# Patient Record
Sex: Male | Born: 1945 | Race: White | Hispanic: No | Marital: Single | Smoking: Never smoker
Health system: Southern US, Community
[De-identification: ages and names within clinical notes are randomized; demographics above are authoritative.]

## PROBLEM LIST (undated history)

## (undated) DIAGNOSIS — F419 Anxiety disorder, unspecified: Secondary | ICD-10-CM

## (undated) DIAGNOSIS — N400 Enlarged prostate without lower urinary tract symptoms: Secondary | ICD-10-CM

## (undated) DIAGNOSIS — I214 Non-ST elevation (NSTEMI) myocardial infarction: Secondary | ICD-10-CM

## (undated) DIAGNOSIS — I4891 Unspecified atrial fibrillation: Secondary | ICD-10-CM

## (undated) DIAGNOSIS — N183 Chronic kidney disease, stage 3 unspecified: Secondary | ICD-10-CM

## (undated) DIAGNOSIS — I429 Cardiomyopathy, unspecified: Secondary | ICD-10-CM

## (undated) DIAGNOSIS — I2510 Atherosclerotic heart disease of native coronary artery without angina pectoris: Secondary | ICD-10-CM

## (undated) DIAGNOSIS — J45909 Unspecified asthma, uncomplicated: Secondary | ICD-10-CM

## (undated) DIAGNOSIS — H3530 Unspecified macular degeneration: Secondary | ICD-10-CM

## (undated) DIAGNOSIS — C439 Malignant melanoma of skin, unspecified: Secondary | ICD-10-CM

## (undated) DIAGNOSIS — I10 Essential (primary) hypertension: Secondary | ICD-10-CM

## (undated) HISTORY — DX: Unspecified atrial fibrillation: I48.91

## (undated) HISTORY — DX: Non-ST elevation (NSTEMI) myocardial infarction: I21.4

## (undated) HISTORY — DX: Cardiomyopathy, unspecified: I42.9

## (undated) HISTORY — DX: Chronic kidney disease, stage 3 unspecified: N18.30

## (undated) HISTORY — DX: Unspecified macular degeneration: H35.30

## (undated) HISTORY — DX: Benign prostatic hyperplasia without lower urinary tract symptoms: N40.0

## (undated) HISTORY — DX: Atherosclerotic heart disease of native coronary artery without angina pectoris: I25.10

## (undated) HISTORY — DX: Essential (primary) hypertension: I10

## (undated) HISTORY — DX: Other postprocedural complications and disorders of the circulatory system, not elsewhere classified: I48.91

## (undated) HISTORY — DX: Malignant melanoma of skin, unspecified: C43.9

## (undated) HISTORY — DX: Unspecified asthma, uncomplicated: J45.909

## (undated) HISTORY — DX: Anxiety disorder, unspecified: F41.9

---

## 2012-01-05 ENCOUNTER — Encounter (INDEPENDENT_AMBULATORY_CARE_PROVIDER_SITE_OTHER): Payer: Self-pay | Admitting: *Deleted

## 2012-01-07 ENCOUNTER — Telehealth (INDEPENDENT_AMBULATORY_CARE_PROVIDER_SITE_OTHER): Payer: Self-pay | Admitting: *Deleted

## 2012-01-07 ENCOUNTER — Other Ambulatory Visit (INDEPENDENT_AMBULATORY_CARE_PROVIDER_SITE_OTHER): Payer: Self-pay | Admitting: *Deleted

## 2012-01-07 ENCOUNTER — Encounter (INDEPENDENT_AMBULATORY_CARE_PROVIDER_SITE_OTHER): Payer: Self-pay | Admitting: *Deleted

## 2012-01-07 DIAGNOSIS — Z1211 Encounter for screening for malignant neoplasm of colon: Secondary | ICD-10-CM

## 2012-01-07 NOTE — Telephone Encounter (Signed)
Patient needs movi prep 

## 2012-01-09 MED ORDER — PEG-KCL-NACL-NASULF-NA ASC-C 100 G PO SOLR: 1.0000 | Freq: Once | ORAL | Status: DC

## 2012-02-16 ENCOUNTER — Telehealth (INDEPENDENT_AMBULATORY_CARE_PROVIDER_SITE_OTHER): Payer: Self-pay | Admitting: *Deleted

## 2012-02-16 NOTE — Telephone Encounter (Signed)
agree

## 2012-02-16 NOTE — Telephone Encounter (Signed)
PCP/Requesting MD: shah  Name & DOB: Craig Forbes 08-Jan-2046     Procedure: tcs  Reason/Indication:  screening  Has patient had this procedure before?  no  If so, when, by whom and where?    Is there a family history of colon cancer?  no  Who?  What age when diagnosed?    Is patient diabetic?   no      Does patient have prosthetic heart valve?  no  Do you have a pacemaker?  no  Has patient had joint replacement within last 12 months?  no  Is patient on Coumadin, Plavix and/or Aspirin? yes  Medications: asa 81 mg daily, amlodipine 10 mg daily, omega 3 fish oil 1000/300 mg bid, red yeast rice 600 mg bid  Allergies: nkda  Medication Adjustment: asa 2 days  Procedure date & time: 03/11/12 at 730

## 2012-02-27 ENCOUNTER — Encounter (HOSPITAL_COMMUNITY): Payer: Self-pay | Admitting: Pharmacy Technician

## 2012-03-10 MED ORDER — SODIUM CHLORIDE 0.45 % IV SOLN
Freq: Once | INTRAVENOUS | Status: AC
  Administered 2012-03-11: 1000 mL via INTRAVENOUS

## 2012-03-11 ENCOUNTER — Encounter (HOSPITAL_COMMUNITY): Payer: Self-pay | Admitting: *Deleted

## 2012-03-11 ENCOUNTER — Ambulatory Visit (HOSPITAL_COMMUNITY)
Admission: RE | Admit: 2012-03-11 | Discharge: 2012-03-11 | Disposition: A | Discharge status: Home / Self Care | Payer: Medicare Other | Source: Ambulatory Visit | Attending: Internal Medicine | Admitting: Internal Medicine

## 2012-03-11 ENCOUNTER — Encounter (HOSPITAL_COMMUNITY)
Admission: RE | Disposition: A | Discharge status: Home / Self Care | Payer: Self-pay | Source: Ambulatory Visit | Attending: Internal Medicine

## 2012-03-11 DIAGNOSIS — K644 Residual hemorrhoidal skin tags: Secondary | ICD-10-CM

## 2012-03-11 DIAGNOSIS — K5730 Diverticulosis of large intestine without perforation or abscess without bleeding: Secondary | ICD-10-CM

## 2012-03-11 DIAGNOSIS — Z1211 Encounter for screening for malignant neoplasm of colon: Secondary | ICD-10-CM | POA: Insufficient documentation

## 2012-03-11 DIAGNOSIS — D126 Benign neoplasm of colon, unspecified: Secondary | ICD-10-CM | POA: Insufficient documentation

## 2012-03-11 HISTORY — PX: COLONOSCOPY: SHX5424

## 2012-03-11 SURGERY — COLONOSCOPY
Anesthesia: Moderate Sedation

## 2012-03-11 MED ORDER — MIDAZOLAM HCL 5 MG/5ML IJ SOLN
INTRAMUSCULAR | Status: DC | PRN
  Administered 2012-03-11: 2 mg via INTRAVENOUS
  Administered 2012-03-11: 1 mg via INTRAVENOUS
  Administered 2012-03-11: 2 mg via INTRAVENOUS

## 2012-03-11 MED ORDER — MIDAZOLAM HCL 5 MG/5ML IJ SOLN
INTRAMUSCULAR | Status: AC
  Filled 2012-03-11: qty 10

## 2012-03-11 MED ORDER — MEPERIDINE HCL 50 MG/ML IJ SOLN
INTRAMUSCULAR | Status: AC
  Filled 2012-03-11: qty 1

## 2012-03-11 MED ORDER — SIMETHICONE 40 MG/0.6ML PO SUSP
ORAL | Status: DC | PRN
  Administered 2012-03-11: 08:00:00

## 2012-03-11 MED ORDER — MEPERIDINE HCL 50 MG/ML IJ SOLN
INTRAMUSCULAR | Status: DC | PRN
  Administered 2012-03-11 (×2): 25 mg via INTRAVENOUS

## 2012-03-11 NOTE — H&P (Signed)
Craig Forbes is an 66 y.o. male.   Chief Complaint: Patient is here for colonoscopy. HPI: Patient is 66 year old Caucasian male who is here for screening colonoscopy. He denies abdominal pain change in his bowel habits rectal bleeding. This is patient's first exam. Family history is negative for colorectal carcinoma. He has hypertension.  History reviewed. No pertinent past medical history.  History reviewed. No pertinent past surgical history.  History reviewed. No pertinent family history. Social History:  reports that he has never smoked. He does not have any smokeless tobacco history on file. He reports that he does not drink alcohol or use illicit drugs.  Allergies: No Known Allergies  Medications Prior to Admission  Medication Sig Dispense Refill  . amLODipine (NORVASC) 10 MG tablet Take 10 mg by mouth daily.      [name] aspirin EC 81 MG tablet Take 81 mg by mouth daily.      . fish oil-omega-3 fatty acids 1000 MG capsule Take 1 g by mouth daily.      . peg 3350 powder (MOVIPREP) 100 G SOLR Take 1 kit (100 g total) by mouth once.  1 kit  0  . Red Yeast Rice 600 MG TABS Take 1 tablet by mouth daily.        No results found for this or any previous visit (from the past 48 hour(s)). No results found.  ROS  Blood pressure 148/94, pulse 91, temperature 97.5 F (36.4 C), temperature source Oral, resp. rate 16, SpO2 96.00%. Physical Exam  Constitutional: He appears well-developed and well-nourished.  HENT:  Mouth/Throat: Oropharynx is clear and moist.  Eyes: Conjunctivae are normal. No scleral icterus.  Neck: No thyromegaly present.  Cardiovascular: Normal rate, regular rhythm and normal heart sounds.   No murmur heard. Respiratory: Effort normal and breath sounds normal.  GI: Soft. He exhibits no distension and no mass. There is no tenderness.  Musculoskeletal: He exhibits no edema.  Lymphadenopathy:    He has no cervical adenopathy.  Neurological: He is alert.  Skin: Skin is  warm and dry.     Assessment/Plan Average risk screening colonoscopy  REHMAN,NAJEEB U 03/11/2012, 7:37 AM

## 2012-03-11 NOTE — Op Note (Signed)
COLONOSCOPY PROCEDURE REPORT  PATIENT:  Craig Forbes  MR#:  [id number] Birthdate:  1945/12/03, 66 y.o., male Endoscopist:  Dr. Rogene Houston, MD Referred By:  Dr. Monico Blitz, MD Procedure Date: 03/11/2012  Procedure:   Colonoscopy  Indications:  Patient is 66 year old Caucasian male was undergoing average risk screening colonoscopy. This is patient's first exam.  Informed Consent:  The procedure and risks were reviewed with the patient and informed consent was obtained.  Medications:  Demerol 50 mg IV Versed 5 mg IV  Description of procedure:  After a digital rectal exam was performed, that colonoscope was advanced from the anus through the rectum and colon to the area of the cecum, ileocecal valve and appendiceal orifice. The cecum was deeply intubated. These structures were well-seen and photographed for the record. From the level of the cecum and ileocecal valve, the scope was slowly and cautiously withdrawn. The mucosal surfaces were carefully surveyed utilizing scope tip to flexion to facilitate fold flattening as needed. The scope was pulled down into the rectum where a thorough exam including retroflexion was performed.  Findings:   Prep excellent. Few scattered diverticula at sigmoid colon. Small polyp ablated via cold biopsy from splenic flexure. Normal rectal mucosa. Mall hemorrhoids below the dentate line.  Therapeutic/Diagnostic Maneuvers Performed:  See above  Complications:  None  Cecal Withdrawal Time:  10 minutes  Impression:  Examination performed to cecum. Small polyp ablated via cold biopsy from splenic flexure. Mild sigmoid colon diverticulosis. Small external hemorrhoids.   Recommendations:  Standard instructions given. I will contact patient with biopsy results. Given today's findings he could wait 10 years before his next exam.   Mavi Un U  03/11/2012 8:07 AM  CC: Dr. Monico Blitz, MD & Dr. Rayne Du ref. provider found

## 2012-03-16 ENCOUNTER — Encounter (HOSPITAL_COMMUNITY): Payer: Self-pay | Admitting: Internal Medicine

## 2012-03-16 ENCOUNTER — Encounter (INDEPENDENT_AMBULATORY_CARE_PROVIDER_SITE_OTHER): Payer: Self-pay | Admitting: *Deleted

## 2014-02-10 ENCOUNTER — Other Ambulatory Visit: Payer: Self-pay | Admitting: Dermatology

## 2014-02-27 ENCOUNTER — Other Ambulatory Visit: Payer: Self-pay | Admitting: Dermatology

## 2014-02-28 ENCOUNTER — Other Ambulatory Visit: Payer: Self-pay | Admitting: Dermatology

## 2015-08-02 DIAGNOSIS — I10 Essential (primary) hypertension: Secondary | ICD-10-CM | POA: Diagnosis not present

## 2015-08-02 DIAGNOSIS — E785 Hyperlipidemia, unspecified: Secondary | ICD-10-CM | POA: Diagnosis not present

## 2015-08-02 DIAGNOSIS — Z418 Encounter for other procedures for purposes other than remedying health state: Secondary | ICD-10-CM | POA: Diagnosis not present

## 2015-08-23 DIAGNOSIS — L814 Other melanin hyperpigmentation: Secondary | ICD-10-CM | POA: Diagnosis not present

## 2015-08-23 DIAGNOSIS — Z85820 Personal history of malignant melanoma of skin: Secondary | ICD-10-CM | POA: Diagnosis not present

## 2015-08-23 DIAGNOSIS — D2262 Melanocytic nevi of left upper limb, including shoulder: Secondary | ICD-10-CM | POA: Diagnosis not present

## 2015-08-23 DIAGNOSIS — D2261 Melanocytic nevi of right upper limb, including shoulder: Secondary | ICD-10-CM | POA: Diagnosis not present

## 2015-08-23 DIAGNOSIS — L738 Other specified follicular disorders: Secondary | ICD-10-CM | POA: Diagnosis not present

## 2015-08-23 DIAGNOSIS — L821 Other seborrheic keratosis: Secondary | ICD-10-CM | POA: Diagnosis not present

## 2015-08-23 DIAGNOSIS — D225 Melanocytic nevi of trunk: Secondary | ICD-10-CM | POA: Diagnosis not present

## 2015-08-23 DIAGNOSIS — D1801 Hemangioma of skin and subcutaneous tissue: Secondary | ICD-10-CM | POA: Diagnosis not present

## 2015-08-23 DIAGNOSIS — I788 Other diseases of capillaries: Secondary | ICD-10-CM | POA: Diagnosis not present

## 2015-09-12 DIAGNOSIS — R972 Elevated prostate specific antigen [PSA]: Secondary | ICD-10-CM | POA: Diagnosis not present

## 2015-09-19 DIAGNOSIS — R972 Elevated prostate specific antigen [PSA]: Secondary | ICD-10-CM | POA: Diagnosis not present

## 2015-09-19 DIAGNOSIS — N402 Nodular prostate without lower urinary tract symptoms: Secondary | ICD-10-CM | POA: Diagnosis not present

## 2015-09-19 DIAGNOSIS — Z0000 Encounter for general adult medical examination without abnormal findings: Secondary | ICD-10-CM | POA: Diagnosis not present

## 2015-10-31 DIAGNOSIS — I10 Essential (primary) hypertension: Secondary | ICD-10-CM | POA: Diagnosis not present

## 2015-10-31 DIAGNOSIS — Z6822 Body mass index (BMI) 22.0-22.9, adult: Secondary | ICD-10-CM | POA: Diagnosis not present

## 2015-10-31 DIAGNOSIS — R972 Elevated prostate specific antigen [PSA]: Secondary | ICD-10-CM | POA: Diagnosis not present

## 2015-10-31 DIAGNOSIS — E785 Hyperlipidemia, unspecified: Secondary | ICD-10-CM | POA: Diagnosis not present

## 2015-10-31 DIAGNOSIS — Z789 Other specified health status: Secondary | ICD-10-CM | POA: Diagnosis not present

## 2016-01-22 DIAGNOSIS — E7800 Pure hypercholesterolemia, unspecified: Secondary | ICD-10-CM | POA: Diagnosis not present

## 2016-01-22 DIAGNOSIS — I10 Essential (primary) hypertension: Secondary | ICD-10-CM | POA: Diagnosis not present

## 2016-02-05 DIAGNOSIS — Z79899 Other long term (current) drug therapy: Secondary | ICD-10-CM | POA: Diagnosis not present

## 2016-02-05 DIAGNOSIS — Z1389 Encounter for screening for other disorder: Secondary | ICD-10-CM | POA: Diagnosis not present

## 2016-02-05 DIAGNOSIS — Z0000 Encounter for general adult medical examination without abnormal findings: Secondary | ICD-10-CM | POA: Diagnosis not present

## 2016-02-05 DIAGNOSIS — Z125 Encounter for screening for malignant neoplasm of prostate: Secondary | ICD-10-CM | POA: Diagnosis not present

## 2016-02-05 DIAGNOSIS — Z1211 Encounter for screening for malignant neoplasm of colon: Secondary | ICD-10-CM | POA: Diagnosis not present

## 2016-02-05 DIAGNOSIS — Z6822 Body mass index (BMI) 22.0-22.9, adult: Secondary | ICD-10-CM | POA: Diagnosis not present

## 2016-02-05 DIAGNOSIS — R5383 Other fatigue: Secondary | ICD-10-CM | POA: Diagnosis not present

## 2016-02-05 DIAGNOSIS — Z7189 Other specified counseling: Secondary | ICD-10-CM | POA: Diagnosis not present

## 2016-02-05 DIAGNOSIS — E785 Hyperlipidemia, unspecified: Secondary | ICD-10-CM | POA: Diagnosis not present

## 2016-02-05 DIAGNOSIS — Z299 Encounter for prophylactic measures, unspecified: Secondary | ICD-10-CM | POA: Diagnosis not present

## 2016-02-07 DIAGNOSIS — L821 Other seborrheic keratosis: Secondary | ICD-10-CM | POA: Diagnosis not present

## 2016-02-07 DIAGNOSIS — D1801 Hemangioma of skin and subcutaneous tissue: Secondary | ICD-10-CM | POA: Diagnosis not present

## 2016-02-07 DIAGNOSIS — L918 Other hypertrophic disorders of the skin: Secondary | ICD-10-CM | POA: Diagnosis not present

## 2016-02-07 DIAGNOSIS — D225 Melanocytic nevi of trunk: Secondary | ICD-10-CM | POA: Diagnosis not present

## 2016-02-07 DIAGNOSIS — Z85820 Personal history of malignant melanoma of skin: Secondary | ICD-10-CM | POA: Diagnosis not present

## 2016-02-07 DIAGNOSIS — D2261 Melanocytic nevi of right upper limb, including shoulder: Secondary | ICD-10-CM | POA: Diagnosis not present

## 2016-02-25 DIAGNOSIS — E7800 Pure hypercholesterolemia, unspecified: Secondary | ICD-10-CM | POA: Diagnosis not present

## 2016-02-25 DIAGNOSIS — I10 Essential (primary) hypertension: Secondary | ICD-10-CM | POA: Diagnosis not present

## 2016-03-19 DIAGNOSIS — I10 Essential (primary) hypertension: Secondary | ICD-10-CM | POA: Diagnosis not present

## 2016-03-19 DIAGNOSIS — E7800 Pure hypercholesterolemia, unspecified: Secondary | ICD-10-CM | POA: Diagnosis not present

## 2016-05-07 DIAGNOSIS — I10 Essential (primary) hypertension: Secondary | ICD-10-CM | POA: Diagnosis not present

## 2016-05-07 DIAGNOSIS — Z713 Dietary counseling and surveillance: Secondary | ICD-10-CM | POA: Diagnosis not present

## 2016-05-07 DIAGNOSIS — Z6823 Body mass index (BMI) 23.0-23.9, adult: Secondary | ICD-10-CM | POA: Diagnosis not present

## 2016-05-07 DIAGNOSIS — E785 Hyperlipidemia, unspecified: Secondary | ICD-10-CM | POA: Diagnosis not present

## 2016-05-21 DIAGNOSIS — Z23 Encounter for immunization: Secondary | ICD-10-CM | POA: Diagnosis not present

## 2016-05-27 DIAGNOSIS — E7800 Pure hypercholesterolemia, unspecified: Secondary | ICD-10-CM | POA: Diagnosis not present

## 2016-05-27 DIAGNOSIS — I10 Essential (primary) hypertension: Secondary | ICD-10-CM | POA: Diagnosis not present

## 2016-06-17 DIAGNOSIS — H35033 Hypertensive retinopathy, bilateral: Secondary | ICD-10-CM | POA: Diagnosis not present

## 2016-07-02 DIAGNOSIS — I10 Essential (primary) hypertension: Secondary | ICD-10-CM | POA: Diagnosis not present

## 2016-07-02 DIAGNOSIS — E7800 Pure hypercholesterolemia, unspecified: Secondary | ICD-10-CM | POA: Diagnosis not present

## 2016-08-06 DIAGNOSIS — L738 Other specified follicular disorders: Secondary | ICD-10-CM | POA: Diagnosis not present

## 2016-08-06 DIAGNOSIS — D1801 Hemangioma of skin and subcutaneous tissue: Secondary | ICD-10-CM | POA: Diagnosis not present

## 2016-08-06 DIAGNOSIS — Z85820 Personal history of malignant melanoma of skin: Secondary | ICD-10-CM | POA: Diagnosis not present

## 2016-08-06 DIAGNOSIS — L570 Actinic keratosis: Secondary | ICD-10-CM | POA: Diagnosis not present

## 2016-08-06 DIAGNOSIS — L814 Other melanin hyperpigmentation: Secondary | ICD-10-CM | POA: Diagnosis not present

## 2016-08-06 DIAGNOSIS — L918 Other hypertrophic disorders of the skin: Secondary | ICD-10-CM | POA: Diagnosis not present

## 2016-08-06 DIAGNOSIS — L821 Other seborrheic keratosis: Secondary | ICD-10-CM | POA: Diagnosis not present

## 2016-08-06 DIAGNOSIS — L820 Inflamed seborrheic keratosis: Secondary | ICD-10-CM | POA: Diagnosis not present

## 2016-08-06 DIAGNOSIS — D225 Melanocytic nevi of trunk: Secondary | ICD-10-CM | POA: Diagnosis not present

## 2016-08-07 DIAGNOSIS — Z299 Encounter for prophylactic measures, unspecified: Secondary | ICD-10-CM | POA: Diagnosis not present

## 2016-08-07 DIAGNOSIS — E785 Hyperlipidemia, unspecified: Secondary | ICD-10-CM | POA: Diagnosis not present

## 2016-08-07 DIAGNOSIS — I10 Essential (primary) hypertension: Secondary | ICD-10-CM | POA: Diagnosis not present

## 2016-08-07 DIAGNOSIS — C439 Malignant melanoma of skin, unspecified: Secondary | ICD-10-CM | POA: Diagnosis not present

## 2016-08-25 DIAGNOSIS — E7800 Pure hypercholesterolemia, unspecified: Secondary | ICD-10-CM | POA: Diagnosis not present

## 2016-08-25 DIAGNOSIS — I10 Essential (primary) hypertension: Secondary | ICD-10-CM | POA: Diagnosis not present

## 2016-09-23 DIAGNOSIS — I10 Essential (primary) hypertension: Secondary | ICD-10-CM | POA: Diagnosis not present

## 2016-09-23 DIAGNOSIS — E7800 Pure hypercholesterolemia, unspecified: Secondary | ICD-10-CM | POA: Diagnosis not present

## 2016-10-27 DIAGNOSIS — R972 Elevated prostate specific antigen [PSA]: Secondary | ICD-10-CM | POA: Diagnosis not present

## 2016-11-03 DIAGNOSIS — R972 Elevated prostate specific antigen [PSA]: Secondary | ICD-10-CM | POA: Diagnosis not present

## 2016-11-03 DIAGNOSIS — I10 Essential (primary) hypertension: Secondary | ICD-10-CM | POA: Diagnosis not present

## 2016-11-03 DIAGNOSIS — E7800 Pure hypercholesterolemia, unspecified: Secondary | ICD-10-CM | POA: Diagnosis not present

## 2016-11-05 DIAGNOSIS — E785 Hyperlipidemia, unspecified: Secondary | ICD-10-CM | POA: Diagnosis not present

## 2016-11-05 DIAGNOSIS — I10 Essential (primary) hypertension: Secondary | ICD-10-CM | POA: Diagnosis not present

## 2016-11-05 DIAGNOSIS — C439 Malignant melanoma of skin, unspecified: Secondary | ICD-10-CM | POA: Diagnosis not present

## 2016-11-05 DIAGNOSIS — N400 Enlarged prostate without lower urinary tract symptoms: Secondary | ICD-10-CM | POA: Diagnosis not present

## 2016-11-05 DIAGNOSIS — R972 Elevated prostate specific antigen [PSA]: Secondary | ICD-10-CM | POA: Diagnosis not present

## 2016-11-05 DIAGNOSIS — Z6823 Body mass index (BMI) 23.0-23.9, adult: Secondary | ICD-10-CM | POA: Diagnosis not present

## 2016-11-05 DIAGNOSIS — Z299 Encounter for prophylactic measures, unspecified: Secondary | ICD-10-CM | POA: Diagnosis not present

## 2016-12-03 DIAGNOSIS — I10 Essential (primary) hypertension: Secondary | ICD-10-CM | POA: Diagnosis not present

## 2016-12-03 DIAGNOSIS — E7800 Pure hypercholesterolemia, unspecified: Secondary | ICD-10-CM | POA: Diagnosis not present

## 2017-02-05 DIAGNOSIS — R5383 Other fatigue: Secondary | ICD-10-CM | POA: Diagnosis not present

## 2017-02-05 DIAGNOSIS — C439 Malignant melanoma of skin, unspecified: Secondary | ICD-10-CM | POA: Diagnosis not present

## 2017-02-05 DIAGNOSIS — Z1211 Encounter for screening for malignant neoplasm of colon: Secondary | ICD-10-CM | POA: Diagnosis not present

## 2017-02-05 DIAGNOSIS — Z7189 Other specified counseling: Secondary | ICD-10-CM | POA: Diagnosis not present

## 2017-02-05 DIAGNOSIS — Z79899 Other long term (current) drug therapy: Secondary | ICD-10-CM | POA: Diagnosis not present

## 2017-02-05 DIAGNOSIS — Z299 Encounter for prophylactic measures, unspecified: Secondary | ICD-10-CM | POA: Diagnosis not present

## 2017-02-05 DIAGNOSIS — Z1389 Encounter for screening for other disorder: Secondary | ICD-10-CM | POA: Diagnosis not present

## 2017-02-05 DIAGNOSIS — Z789 Other specified health status: Secondary | ICD-10-CM | POA: Diagnosis not present

## 2017-02-05 DIAGNOSIS — Z0000 Encounter for general adult medical examination without abnormal findings: Secondary | ICD-10-CM | POA: Diagnosis not present

## 2017-02-05 DIAGNOSIS — N400 Enlarged prostate without lower urinary tract symptoms: Secondary | ICD-10-CM | POA: Diagnosis not present

## 2017-02-05 DIAGNOSIS — Z6823 Body mass index (BMI) 23.0-23.9, adult: Secondary | ICD-10-CM | POA: Diagnosis not present

## 2017-02-05 DIAGNOSIS — Z125 Encounter for screening for malignant neoplasm of prostate: Secondary | ICD-10-CM | POA: Diagnosis not present

## 2017-02-05 DIAGNOSIS — E785 Hyperlipidemia, unspecified: Secondary | ICD-10-CM | POA: Diagnosis not present

## 2017-02-13 DIAGNOSIS — E7800 Pure hypercholesterolemia, unspecified: Secondary | ICD-10-CM | POA: Diagnosis not present

## 2017-02-13 DIAGNOSIS — D2261 Melanocytic nevi of right upper limb, including shoulder: Secondary | ICD-10-CM | POA: Diagnosis not present

## 2017-02-13 DIAGNOSIS — D2262 Melanocytic nevi of left upper limb, including shoulder: Secondary | ICD-10-CM | POA: Diagnosis not present

## 2017-02-13 DIAGNOSIS — Z85820 Personal history of malignant melanoma of skin: Secondary | ICD-10-CM | POA: Diagnosis not present

## 2017-02-13 DIAGNOSIS — D2272 Melanocytic nevi of left lower limb, including hip: Secondary | ICD-10-CM | POA: Diagnosis not present

## 2017-02-13 DIAGNOSIS — I10 Essential (primary) hypertension: Secondary | ICD-10-CM | POA: Diagnosis not present

## 2017-02-13 DIAGNOSIS — D1801 Hemangioma of skin and subcutaneous tissue: Secondary | ICD-10-CM | POA: Diagnosis not present

## 2017-02-13 DIAGNOSIS — L814 Other melanin hyperpigmentation: Secondary | ICD-10-CM | POA: Diagnosis not present

## 2017-02-13 DIAGNOSIS — D225 Melanocytic nevi of trunk: Secondary | ICD-10-CM | POA: Diagnosis not present

## 2017-02-13 DIAGNOSIS — L821 Other seborrheic keratosis: Secondary | ICD-10-CM | POA: Diagnosis not present

## 2017-03-24 DIAGNOSIS — E7800 Pure hypercholesterolemia, unspecified: Secondary | ICD-10-CM | POA: Diagnosis not present

## 2017-03-24 DIAGNOSIS — I10 Essential (primary) hypertension: Secondary | ICD-10-CM | POA: Diagnosis not present

## 2017-04-03 DIAGNOSIS — E7800 Pure hypercholesterolemia, unspecified: Secondary | ICD-10-CM | POA: Diagnosis not present

## 2017-04-03 DIAGNOSIS — I10 Essential (primary) hypertension: Secondary | ICD-10-CM | POA: Diagnosis not present

## 2017-05-14 DIAGNOSIS — N400 Enlarged prostate without lower urinary tract symptoms: Secondary | ICD-10-CM | POA: Diagnosis not present

## 2017-05-14 DIAGNOSIS — C439 Malignant melanoma of skin, unspecified: Secondary | ICD-10-CM | POA: Diagnosis not present

## 2017-05-14 DIAGNOSIS — I10 Essential (primary) hypertension: Secondary | ICD-10-CM | POA: Diagnosis not present

## 2017-05-14 DIAGNOSIS — Z299 Encounter for prophylactic measures, unspecified: Secondary | ICD-10-CM | POA: Diagnosis not present

## 2017-05-14 DIAGNOSIS — Z6823 Body mass index (BMI) 23.0-23.9, adult: Secondary | ICD-10-CM | POA: Diagnosis not present

## 2017-05-14 DIAGNOSIS — E785 Hyperlipidemia, unspecified: Secondary | ICD-10-CM | POA: Diagnosis not present

## 2017-06-30 DIAGNOSIS — I10 Essential (primary) hypertension: Secondary | ICD-10-CM | POA: Diagnosis not present

## 2017-06-30 DIAGNOSIS — E7800 Pure hypercholesterolemia, unspecified: Secondary | ICD-10-CM | POA: Diagnosis not present

## 2017-08-06 DIAGNOSIS — E7800 Pure hypercholesterolemia, unspecified: Secondary | ICD-10-CM | POA: Diagnosis not present

## 2017-08-06 DIAGNOSIS — I10 Essential (primary) hypertension: Secondary | ICD-10-CM | POA: Diagnosis not present

## 2017-08-18 DIAGNOSIS — Z789 Other specified health status: Secondary | ICD-10-CM | POA: Diagnosis not present

## 2017-08-18 DIAGNOSIS — N400 Enlarged prostate without lower urinary tract symptoms: Secondary | ICD-10-CM | POA: Diagnosis not present

## 2017-08-18 DIAGNOSIS — Z6823 Body mass index (BMI) 23.0-23.9, adult: Secondary | ICD-10-CM | POA: Diagnosis not present

## 2017-08-18 DIAGNOSIS — I10 Essential (primary) hypertension: Secondary | ICD-10-CM | POA: Diagnosis not present

## 2017-08-18 DIAGNOSIS — Z299 Encounter for prophylactic measures, unspecified: Secondary | ICD-10-CM | POA: Diagnosis not present

## 2017-08-18 DIAGNOSIS — C439 Malignant melanoma of skin, unspecified: Secondary | ICD-10-CM | POA: Diagnosis not present

## 2017-08-18 DIAGNOSIS — E785 Hyperlipidemia, unspecified: Secondary | ICD-10-CM | POA: Diagnosis not present

## 2017-08-19 DIAGNOSIS — D225 Melanocytic nevi of trunk: Secondary | ICD-10-CM | POA: Diagnosis not present

## 2017-08-19 DIAGNOSIS — L918 Other hypertrophic disorders of the skin: Secondary | ICD-10-CM | POA: Diagnosis not present

## 2017-08-19 DIAGNOSIS — Z85820 Personal history of malignant melanoma of skin: Secondary | ICD-10-CM | POA: Diagnosis not present

## 2017-08-19 DIAGNOSIS — L308 Other specified dermatitis: Secondary | ICD-10-CM | POA: Diagnosis not present

## 2017-08-19 DIAGNOSIS — L821 Other seborrheic keratosis: Secondary | ICD-10-CM | POA: Diagnosis not present

## 2017-08-19 DIAGNOSIS — D1801 Hemangioma of skin and subcutaneous tissue: Secondary | ICD-10-CM | POA: Diagnosis not present

## 2017-08-20 DIAGNOSIS — H35033 Hypertensive retinopathy, bilateral: Secondary | ICD-10-CM | POA: Diagnosis not present

## 2017-09-02 DIAGNOSIS — E7800 Pure hypercholesterolemia, unspecified: Secondary | ICD-10-CM | POA: Diagnosis not present

## 2017-09-02 DIAGNOSIS — I10 Essential (primary) hypertension: Secondary | ICD-10-CM | POA: Diagnosis not present

## 2017-10-09 DIAGNOSIS — I10 Essential (primary) hypertension: Secondary | ICD-10-CM | POA: Diagnosis not present

## 2017-10-09 DIAGNOSIS — E7800 Pure hypercholesterolemia, unspecified: Secondary | ICD-10-CM | POA: Diagnosis not present

## 2017-11-04 DIAGNOSIS — I10 Essential (primary) hypertension: Secondary | ICD-10-CM | POA: Diagnosis not present

## 2017-11-04 DIAGNOSIS — E7800 Pure hypercholesterolemia, unspecified: Secondary | ICD-10-CM | POA: Diagnosis not present

## 2017-11-19 DIAGNOSIS — J309 Allergic rhinitis, unspecified: Secondary | ICD-10-CM | POA: Diagnosis not present

## 2017-11-19 DIAGNOSIS — C439 Malignant melanoma of skin, unspecified: Secondary | ICD-10-CM | POA: Diagnosis not present

## 2017-11-19 DIAGNOSIS — Z6823 Body mass index (BMI) 23.0-23.9, adult: Secondary | ICD-10-CM | POA: Diagnosis not present

## 2017-11-19 DIAGNOSIS — I10 Essential (primary) hypertension: Secondary | ICD-10-CM | POA: Diagnosis not present

## 2017-11-19 DIAGNOSIS — Z299 Encounter for prophylactic measures, unspecified: Secondary | ICD-10-CM | POA: Diagnosis not present

## 2017-12-07 DIAGNOSIS — R972 Elevated prostate specific antigen [PSA]: Secondary | ICD-10-CM | POA: Diagnosis not present

## 2017-12-16 DIAGNOSIS — R972 Elevated prostate specific antigen [PSA]: Secondary | ICD-10-CM | POA: Diagnosis not present

## 2018-02-10 DIAGNOSIS — Z7189 Other specified counseling: Secondary | ICD-10-CM | POA: Diagnosis not present

## 2018-02-10 DIAGNOSIS — Z299 Encounter for prophylactic measures, unspecified: Secondary | ICD-10-CM | POA: Diagnosis not present

## 2018-02-10 DIAGNOSIS — I10 Essential (primary) hypertension: Secondary | ICD-10-CM | POA: Diagnosis not present

## 2018-02-10 DIAGNOSIS — Z1339 Encounter for screening examination for other mental health and behavioral disorders: Secondary | ICD-10-CM | POA: Diagnosis not present

## 2018-02-10 DIAGNOSIS — Z0000 Encounter for general adult medical examination without abnormal findings: Secondary | ICD-10-CM | POA: Diagnosis not present

## 2018-02-10 DIAGNOSIS — E7800 Pure hypercholesterolemia, unspecified: Secondary | ICD-10-CM | POA: Diagnosis not present

## 2018-02-10 DIAGNOSIS — C439 Malignant melanoma of skin, unspecified: Secondary | ICD-10-CM | POA: Diagnosis not present

## 2018-02-10 DIAGNOSIS — Z6823 Body mass index (BMI) 23.0-23.9, adult: Secondary | ICD-10-CM | POA: Diagnosis not present

## 2018-02-10 DIAGNOSIS — Z1211 Encounter for screening for malignant neoplasm of colon: Secondary | ICD-10-CM | POA: Diagnosis not present

## 2018-02-10 DIAGNOSIS — Z1331 Encounter for screening for depression: Secondary | ICD-10-CM | POA: Diagnosis not present

## 2018-02-10 DIAGNOSIS — Z79899 Other long term (current) drug therapy: Secondary | ICD-10-CM | POA: Diagnosis not present

## 2018-02-10 DIAGNOSIS — Z125 Encounter for screening for malignant neoplasm of prostate: Secondary | ICD-10-CM | POA: Diagnosis not present

## 2018-02-12 DIAGNOSIS — E7800 Pure hypercholesterolemia, unspecified: Secondary | ICD-10-CM | POA: Diagnosis not present

## 2018-02-12 DIAGNOSIS — I10 Essential (primary) hypertension: Secondary | ICD-10-CM | POA: Diagnosis not present

## 2018-02-26 DIAGNOSIS — L814 Other melanin hyperpigmentation: Secondary | ICD-10-CM | POA: Diagnosis not present

## 2018-02-26 DIAGNOSIS — D2261 Melanocytic nevi of right upper limb, including shoulder: Secondary | ICD-10-CM | POA: Diagnosis not present

## 2018-02-26 DIAGNOSIS — L738 Other specified follicular disorders: Secondary | ICD-10-CM | POA: Diagnosis not present

## 2018-02-26 DIAGNOSIS — L918 Other hypertrophic disorders of the skin: Secondary | ICD-10-CM | POA: Diagnosis not present

## 2018-02-26 DIAGNOSIS — L821 Other seborrheic keratosis: Secondary | ICD-10-CM | POA: Diagnosis not present

## 2018-02-26 DIAGNOSIS — Z85820 Personal history of malignant melanoma of skin: Secondary | ICD-10-CM | POA: Diagnosis not present

## 2018-02-26 DIAGNOSIS — B078 Other viral warts: Secondary | ICD-10-CM | POA: Diagnosis not present

## 2018-02-26 DIAGNOSIS — D225 Melanocytic nevi of trunk: Secondary | ICD-10-CM | POA: Diagnosis not present

## 2018-02-26 DIAGNOSIS — D1801 Hemangioma of skin and subcutaneous tissue: Secondary | ICD-10-CM | POA: Diagnosis not present

## 2018-04-07 DIAGNOSIS — R310 Gross hematuria: Secondary | ICD-10-CM | POA: Diagnosis not present

## 2018-04-13 DIAGNOSIS — R310 Gross hematuria: Secondary | ICD-10-CM | POA: Diagnosis not present

## 2018-04-13 DIAGNOSIS — N281 Cyst of kidney, acquired: Secondary | ICD-10-CM | POA: Diagnosis not present

## 2018-04-15 DIAGNOSIS — I10 Essential (primary) hypertension: Secondary | ICD-10-CM | POA: Diagnosis not present

## 2018-04-15 DIAGNOSIS — E7800 Pure hypercholesterolemia, unspecified: Secondary | ICD-10-CM | POA: Diagnosis not present

## 2018-05-12 DIAGNOSIS — R3912 Poor urinary stream: Secondary | ICD-10-CM | POA: Diagnosis not present

## 2018-05-12 DIAGNOSIS — R310 Gross hematuria: Secondary | ICD-10-CM | POA: Diagnosis not present

## 2018-05-12 DIAGNOSIS — D49512 Neoplasm of unspecified behavior of left kidney: Secondary | ICD-10-CM | POA: Diagnosis not present

## 2018-05-12 DIAGNOSIS — N401 Enlarged prostate with lower urinary tract symptoms: Secondary | ICD-10-CM | POA: Diagnosis not present

## 2018-05-21 DIAGNOSIS — I10 Essential (primary) hypertension: Secondary | ICD-10-CM | POA: Diagnosis not present

## 2018-05-21 DIAGNOSIS — N400 Enlarged prostate without lower urinary tract symptoms: Secondary | ICD-10-CM | POA: Diagnosis not present

## 2018-05-21 DIAGNOSIS — R972 Elevated prostate specific antigen [PSA]: Secondary | ICD-10-CM | POA: Diagnosis not present

## 2018-05-21 DIAGNOSIS — Z299 Encounter for prophylactic measures, unspecified: Secondary | ICD-10-CM | POA: Diagnosis not present

## 2018-05-21 DIAGNOSIS — N281 Cyst of kidney, acquired: Secondary | ICD-10-CM | POA: Diagnosis not present

## 2018-05-21 DIAGNOSIS — Z6823 Body mass index (BMI) 23.0-23.9, adult: Secondary | ICD-10-CM | POA: Diagnosis not present

## 2018-06-17 DIAGNOSIS — E7800 Pure hypercholesterolemia, unspecified: Secondary | ICD-10-CM | POA: Diagnosis not present

## 2018-06-17 DIAGNOSIS — I10 Essential (primary) hypertension: Secondary | ICD-10-CM | POA: Diagnosis not present

## 2018-06-21 DIAGNOSIS — R3914 Feeling of incomplete bladder emptying: Secondary | ICD-10-CM | POA: Diagnosis not present

## 2018-06-21 DIAGNOSIS — R310 Gross hematuria: Secondary | ICD-10-CM | POA: Diagnosis not present

## 2018-06-21 DIAGNOSIS — N401 Enlarged prostate with lower urinary tract symptoms: Secondary | ICD-10-CM | POA: Diagnosis not present

## 2018-06-28 DIAGNOSIS — R3912 Poor urinary stream: Secondary | ICD-10-CM | POA: Diagnosis not present

## 2018-06-28 DIAGNOSIS — D49512 Neoplasm of unspecified behavior of left kidney: Secondary | ICD-10-CM | POA: Diagnosis not present

## 2018-06-28 DIAGNOSIS — R310 Gross hematuria: Secondary | ICD-10-CM | POA: Diagnosis not present

## 2018-06-28 DIAGNOSIS — N401 Enlarged prostate with lower urinary tract symptoms: Secondary | ICD-10-CM | POA: Diagnosis not present

## 2018-07-15 DIAGNOSIS — E7800 Pure hypercholesterolemia, unspecified: Secondary | ICD-10-CM | POA: Diagnosis not present

## 2018-07-15 DIAGNOSIS — I10 Essential (primary) hypertension: Secondary | ICD-10-CM | POA: Diagnosis not present

## 2018-08-11 DIAGNOSIS — I10 Essential (primary) hypertension: Secondary | ICD-10-CM | POA: Diagnosis not present

## 2018-08-11 DIAGNOSIS — E7800 Pure hypercholesterolemia, unspecified: Secondary | ICD-10-CM | POA: Diagnosis not present

## 2018-08-24 DIAGNOSIS — N400 Enlarged prostate without lower urinary tract symptoms: Secondary | ICD-10-CM | POA: Diagnosis not present

## 2018-08-24 DIAGNOSIS — Z6823 Body mass index (BMI) 23.0-23.9, adult: Secondary | ICD-10-CM | POA: Diagnosis not present

## 2018-08-24 DIAGNOSIS — I10 Essential (primary) hypertension: Secondary | ICD-10-CM | POA: Diagnosis not present

## 2018-08-24 DIAGNOSIS — Z789 Other specified health status: Secondary | ICD-10-CM | POA: Diagnosis not present

## 2018-08-24 DIAGNOSIS — Z299 Encounter for prophylactic measures, unspecified: Secondary | ICD-10-CM | POA: Diagnosis not present

## 2018-09-07 DIAGNOSIS — E7800 Pure hypercholesterolemia, unspecified: Secondary | ICD-10-CM | POA: Diagnosis not present

## 2018-09-07 DIAGNOSIS — I10 Essential (primary) hypertension: Secondary | ICD-10-CM | POA: Diagnosis not present

## 2018-09-08 DIAGNOSIS — Z85820 Personal history of malignant melanoma of skin: Secondary | ICD-10-CM | POA: Diagnosis not present

## 2018-09-08 DIAGNOSIS — D2262 Melanocytic nevi of left upper limb, including shoulder: Secondary | ICD-10-CM | POA: Diagnosis not present

## 2018-09-08 DIAGNOSIS — L821 Other seborrheic keratosis: Secondary | ICD-10-CM | POA: Diagnosis not present

## 2018-09-08 DIAGNOSIS — L218 Other seborrheic dermatitis: Secondary | ICD-10-CM | POA: Diagnosis not present

## 2018-09-08 DIAGNOSIS — D225 Melanocytic nevi of trunk: Secondary | ICD-10-CM | POA: Diagnosis not present

## 2018-09-08 DIAGNOSIS — D2261 Melanocytic nevi of right upper limb, including shoulder: Secondary | ICD-10-CM | POA: Diagnosis not present

## 2018-09-08 DIAGNOSIS — D1801 Hemangioma of skin and subcutaneous tissue: Secondary | ICD-10-CM | POA: Diagnosis not present

## 2018-09-08 DIAGNOSIS — L918 Other hypertrophic disorders of the skin: Secondary | ICD-10-CM | POA: Diagnosis not present

## 2018-09-08 DIAGNOSIS — L814 Other melanin hyperpigmentation: Secondary | ICD-10-CM | POA: Diagnosis not present

## 2018-09-20 ENCOUNTER — Other Ambulatory Visit: Payer: Self-pay | Admitting: Urology

## 2018-09-20 ENCOUNTER — Other Ambulatory Visit (HOSPITAL_COMMUNITY): Payer: Self-pay | Admitting: Urology

## 2018-09-20 DIAGNOSIS — D49512 Neoplasm of unspecified behavior of left kidney: Secondary | ICD-10-CM

## 2018-10-25 DIAGNOSIS — E7800 Pure hypercholesterolemia, unspecified: Secondary | ICD-10-CM | POA: Diagnosis not present

## 2018-10-25 DIAGNOSIS — I10 Essential (primary) hypertension: Secondary | ICD-10-CM | POA: Diagnosis not present

## 2018-11-15 ENCOUNTER — Encounter (HOSPITAL_COMMUNITY): Payer: Self-pay

## 2018-11-15 ENCOUNTER — Ambulatory Visit (HOSPITAL_COMMUNITY): Payer: Medicare Other

## 2018-11-22 DIAGNOSIS — I10 Essential (primary) hypertension: Secondary | ICD-10-CM | POA: Diagnosis not present

## 2018-11-22 DIAGNOSIS — E7800 Pure hypercholesterolemia, unspecified: Secondary | ICD-10-CM | POA: Diagnosis not present

## 2018-12-07 ENCOUNTER — Ambulatory Visit (HOSPITAL_COMMUNITY)
Admission: RE | Admit: 2018-12-07 | Discharge: 2018-12-07 | Disposition: A | Discharge status: Home / Self Care | Payer: Medicare Other | Source: Ambulatory Visit | Attending: Urology | Admitting: Urology

## 2018-12-07 ENCOUNTER — Other Ambulatory Visit: Payer: Self-pay

## 2018-12-07 DIAGNOSIS — D49512 Neoplasm of unspecified behavior of left kidney: Secondary | ICD-10-CM

## 2018-12-07 DIAGNOSIS — K7689 Other specified diseases of liver: Secondary | ICD-10-CM | POA: Diagnosis not present

## 2018-12-07 DIAGNOSIS — N281 Cyst of kidney, acquired: Secondary | ICD-10-CM | POA: Diagnosis not present

## 2018-12-07 DIAGNOSIS — R972 Elevated prostate specific antigen [PSA]: Secondary | ICD-10-CM | POA: Diagnosis not present

## 2018-12-07 LAB — POCT I-STAT CREATININE: Creatinine, Ser: 1.4 mg/dL — ABNORMAL HIGH (ref 0.61–1.24)

## 2018-12-07 MED ORDER — GADOBUTROL 1 MMOL/ML IV SOLN
7.0000 mL | Freq: Once | INTRAVENOUS | Status: AC | PRN
  Administered 2018-12-07: 7 mL via INTRAVENOUS

## 2018-12-14 DIAGNOSIS — N401 Enlarged prostate with lower urinary tract symptoms: Secondary | ICD-10-CM | POA: Diagnosis not present

## 2018-12-14 DIAGNOSIS — R972 Elevated prostate specific antigen [PSA]: Secondary | ICD-10-CM | POA: Diagnosis not present

## 2018-12-14 DIAGNOSIS — R310 Gross hematuria: Secondary | ICD-10-CM | POA: Diagnosis not present

## 2018-12-14 DIAGNOSIS — R3914 Feeling of incomplete bladder emptying: Secondary | ICD-10-CM | POA: Diagnosis not present

## 2018-12-14 DIAGNOSIS — D49512 Neoplasm of unspecified behavior of left kidney: Secondary | ICD-10-CM | POA: Diagnosis not present

## 2018-12-28 DIAGNOSIS — I10 Essential (primary) hypertension: Secondary | ICD-10-CM | POA: Diagnosis not present

## 2018-12-28 DIAGNOSIS — E7800 Pure hypercholesterolemia, unspecified: Secondary | ICD-10-CM | POA: Diagnosis not present

## 2019-01-04 DIAGNOSIS — L821 Other seborrheic keratosis: Secondary | ICD-10-CM | POA: Diagnosis not present

## 2019-02-21 DIAGNOSIS — Z1331 Encounter for screening for depression: Secondary | ICD-10-CM | POA: Diagnosis not present

## 2019-02-21 DIAGNOSIS — Z6823 Body mass index (BMI) 23.0-23.9, adult: Secondary | ICD-10-CM | POA: Diagnosis not present

## 2019-02-21 DIAGNOSIS — Z7189 Other specified counseling: Secondary | ICD-10-CM | POA: Diagnosis not present

## 2019-02-21 DIAGNOSIS — N400 Enlarged prostate without lower urinary tract symptoms: Secondary | ICD-10-CM | POA: Diagnosis not present

## 2019-02-21 DIAGNOSIS — Z79899 Other long term (current) drug therapy: Secondary | ICD-10-CM | POA: Diagnosis not present

## 2019-02-21 DIAGNOSIS — Z299 Encounter for prophylactic measures, unspecified: Secondary | ICD-10-CM | POA: Diagnosis not present

## 2019-02-21 DIAGNOSIS — Z1339 Encounter for screening examination for other mental health and behavioral disorders: Secondary | ICD-10-CM | POA: Diagnosis not present

## 2019-02-21 DIAGNOSIS — C439 Malignant melanoma of skin, unspecified: Secondary | ICD-10-CM | POA: Diagnosis not present

## 2019-02-21 DIAGNOSIS — Z1211 Encounter for screening for malignant neoplasm of colon: Secondary | ICD-10-CM | POA: Diagnosis not present

## 2019-02-21 DIAGNOSIS — E785 Hyperlipidemia, unspecified: Secondary | ICD-10-CM | POA: Diagnosis not present

## 2019-02-21 DIAGNOSIS — I10 Essential (primary) hypertension: Secondary | ICD-10-CM | POA: Diagnosis not present

## 2019-02-21 DIAGNOSIS — R5383 Other fatigue: Secondary | ICD-10-CM | POA: Diagnosis not present

## 2019-02-21 DIAGNOSIS — Z0000 Encounter for general adult medical examination without abnormal findings: Secondary | ICD-10-CM | POA: Diagnosis not present

## 2019-02-22 DIAGNOSIS — E7800 Pure hypercholesterolemia, unspecified: Secondary | ICD-10-CM | POA: Diagnosis not present

## 2019-02-22 DIAGNOSIS — I10 Essential (primary) hypertension: Secondary | ICD-10-CM | POA: Diagnosis not present

## 2019-03-18 DIAGNOSIS — I10 Essential (primary) hypertension: Secondary | ICD-10-CM | POA: Diagnosis not present

## 2019-03-18 DIAGNOSIS — E7800 Pure hypercholesterolemia, unspecified: Secondary | ICD-10-CM | POA: Diagnosis not present

## 2019-03-24 DIAGNOSIS — L2089 Other atopic dermatitis: Secondary | ICD-10-CM | POA: Diagnosis not present

## 2019-03-24 DIAGNOSIS — L814 Other melanin hyperpigmentation: Secondary | ICD-10-CM | POA: Diagnosis not present

## 2019-03-24 DIAGNOSIS — D225 Melanocytic nevi of trunk: Secondary | ICD-10-CM | POA: Diagnosis not present

## 2019-03-24 DIAGNOSIS — D1801 Hemangioma of skin and subcutaneous tissue: Secondary | ICD-10-CM | POA: Diagnosis not present

## 2019-03-24 DIAGNOSIS — D2261 Melanocytic nevi of right upper limb, including shoulder: Secondary | ICD-10-CM | POA: Diagnosis not present

## 2019-03-24 DIAGNOSIS — Z85820 Personal history of malignant melanoma of skin: Secondary | ICD-10-CM | POA: Diagnosis not present

## 2019-03-24 DIAGNOSIS — D2262 Melanocytic nevi of left upper limb, including shoulder: Secondary | ICD-10-CM | POA: Diagnosis not present

## 2019-03-24 DIAGNOSIS — L918 Other hypertrophic disorders of the skin: Secondary | ICD-10-CM | POA: Diagnosis not present

## 2019-03-24 DIAGNOSIS — L821 Other seborrheic keratosis: Secondary | ICD-10-CM | POA: Diagnosis not present

## 2019-03-24 DIAGNOSIS — L570 Actinic keratosis: Secondary | ICD-10-CM | POA: Diagnosis not present

## 2019-03-24 DIAGNOSIS — L720 Epidermal cyst: Secondary | ICD-10-CM | POA: Diagnosis not present

## 2019-03-24 DIAGNOSIS — L738 Other specified follicular disorders: Secondary | ICD-10-CM | POA: Diagnosis not present

## 2019-04-12 DIAGNOSIS — E7800 Pure hypercholesterolemia, unspecified: Secondary | ICD-10-CM | POA: Diagnosis not present

## 2019-04-12 DIAGNOSIS — I10 Essential (primary) hypertension: Secondary | ICD-10-CM | POA: Diagnosis not present

## 2019-05-02 DIAGNOSIS — E7800 Pure hypercholesterolemia, unspecified: Secondary | ICD-10-CM | POA: Diagnosis not present

## 2019-05-02 DIAGNOSIS — I10 Essential (primary) hypertension: Secondary | ICD-10-CM | POA: Diagnosis not present

## 2019-05-24 DIAGNOSIS — Z299 Encounter for prophylactic measures, unspecified: Secondary | ICD-10-CM | POA: Diagnosis not present

## 2019-05-24 DIAGNOSIS — I10 Essential (primary) hypertension: Secondary | ICD-10-CM | POA: Diagnosis not present

## 2019-05-24 DIAGNOSIS — Z6823 Body mass index (BMI) 23.0-23.9, adult: Secondary | ICD-10-CM | POA: Diagnosis not present

## 2019-05-24 DIAGNOSIS — Z713 Dietary counseling and surveillance: Secondary | ICD-10-CM | POA: Diagnosis not present

## 2019-05-24 DIAGNOSIS — C439 Malignant melanoma of skin, unspecified: Secondary | ICD-10-CM | POA: Diagnosis not present

## 2019-07-12 DIAGNOSIS — I10 Essential (primary) hypertension: Secondary | ICD-10-CM | POA: Diagnosis not present

## 2019-07-12 DIAGNOSIS — E7800 Pure hypercholesterolemia, unspecified: Secondary | ICD-10-CM | POA: Diagnosis not present

## 2019-08-01 DIAGNOSIS — E7800 Pure hypercholesterolemia, unspecified: Secondary | ICD-10-CM | POA: Diagnosis not present

## 2019-08-01 DIAGNOSIS — I10 Essential (primary) hypertension: Secondary | ICD-10-CM | POA: Diagnosis not present

## 2019-08-24 DIAGNOSIS — B009 Herpesviral infection, unspecified: Secondary | ICD-10-CM | POA: Diagnosis not present

## 2019-08-24 DIAGNOSIS — Z299 Encounter for prophylactic measures, unspecified: Secondary | ICD-10-CM | POA: Diagnosis not present

## 2019-08-24 DIAGNOSIS — I10 Essential (primary) hypertension: Secondary | ICD-10-CM | POA: Diagnosis not present

## 2019-08-24 DIAGNOSIS — C439 Malignant melanoma of skin, unspecified: Secondary | ICD-10-CM | POA: Diagnosis not present

## 2019-08-24 DIAGNOSIS — Z6823 Body mass index (BMI) 23.0-23.9, adult: Secondary | ICD-10-CM | POA: Diagnosis not present

## 2019-09-07 DIAGNOSIS — Z6823 Body mass index (BMI) 23.0-23.9, adult: Secondary | ICD-10-CM | POA: Diagnosis not present

## 2019-09-07 DIAGNOSIS — Z789 Other specified health status: Secondary | ICD-10-CM | POA: Diagnosis not present

## 2019-09-07 DIAGNOSIS — Z299 Encounter for prophylactic measures, unspecified: Secondary | ICD-10-CM | POA: Diagnosis not present

## 2019-09-07 DIAGNOSIS — C439 Malignant melanoma of skin, unspecified: Secondary | ICD-10-CM | POA: Diagnosis not present

## 2019-09-07 DIAGNOSIS — N400 Enlarged prostate without lower urinary tract symptoms: Secondary | ICD-10-CM | POA: Diagnosis not present

## 2019-09-07 DIAGNOSIS — I10 Essential (primary) hypertension: Secondary | ICD-10-CM | POA: Diagnosis not present

## 2019-09-07 DIAGNOSIS — F419 Anxiety disorder, unspecified: Secondary | ICD-10-CM | POA: Diagnosis not present

## 2019-09-09 DIAGNOSIS — D485 Neoplasm of uncertain behavior of skin: Secondary | ICD-10-CM | POA: Diagnosis not present

## 2019-09-09 DIAGNOSIS — C44222 Squamous cell carcinoma of skin of right ear and external auricular canal: Secondary | ICD-10-CM | POA: Diagnosis not present

## 2019-09-14 DIAGNOSIS — I10 Essential (primary) hypertension: Secondary | ICD-10-CM | POA: Diagnosis not present

## 2019-09-14 DIAGNOSIS — E7800 Pure hypercholesterolemia, unspecified: Secondary | ICD-10-CM | POA: Diagnosis not present

## 2019-09-27 DIAGNOSIS — Z85828 Personal history of other malignant neoplasm of skin: Secondary | ICD-10-CM | POA: Diagnosis not present

## 2019-09-27 DIAGNOSIS — C44222 Squamous cell carcinoma of skin of right ear and external auricular canal: Secondary | ICD-10-CM | POA: Diagnosis not present

## 2019-10-04 DIAGNOSIS — E7800 Pure hypercholesterolemia, unspecified: Secondary | ICD-10-CM | POA: Diagnosis not present

## 2019-10-04 DIAGNOSIS — I10 Essential (primary) hypertension: Secondary | ICD-10-CM | POA: Diagnosis not present

## 2019-11-04 DIAGNOSIS — I10 Essential (primary) hypertension: Secondary | ICD-10-CM | POA: Diagnosis not present

## 2019-11-04 DIAGNOSIS — E7800 Pure hypercholesterolemia, unspecified: Secondary | ICD-10-CM | POA: Diagnosis not present

## 2019-12-07 DIAGNOSIS — I10 Essential (primary) hypertension: Secondary | ICD-10-CM | POA: Diagnosis not present

## 2019-12-07 DIAGNOSIS — R202 Paresthesia of skin: Secondary | ICD-10-CM | POA: Diagnosis not present

## 2019-12-07 DIAGNOSIS — N400 Enlarged prostate without lower urinary tract symptoms: Secondary | ICD-10-CM | POA: Diagnosis not present

## 2019-12-07 DIAGNOSIS — Z299 Encounter for prophylactic measures, unspecified: Secondary | ICD-10-CM | POA: Diagnosis not present

## 2019-12-07 DIAGNOSIS — R200 Anesthesia of skin: Secondary | ICD-10-CM | POA: Diagnosis not present

## 2019-12-14 DIAGNOSIS — M549 Dorsalgia, unspecified: Secondary | ICD-10-CM | POA: Diagnosis not present

## 2019-12-14 DIAGNOSIS — Z299 Encounter for prophylactic measures, unspecified: Secondary | ICD-10-CM | POA: Diagnosis not present

## 2019-12-14 DIAGNOSIS — C439 Malignant melanoma of skin, unspecified: Secondary | ICD-10-CM | POA: Diagnosis not present

## 2019-12-14 DIAGNOSIS — I10 Essential (primary) hypertension: Secondary | ICD-10-CM | POA: Diagnosis not present

## 2019-12-14 DIAGNOSIS — N390 Urinary tract infection, site not specified: Secondary | ICD-10-CM | POA: Diagnosis not present

## 2019-12-25 DIAGNOSIS — E7800 Pure hypercholesterolemia, unspecified: Secondary | ICD-10-CM | POA: Diagnosis not present

## 2019-12-25 DIAGNOSIS — I10 Essential (primary) hypertension: Secondary | ICD-10-CM | POA: Diagnosis not present

## 2020-01-25 DIAGNOSIS — I10 Essential (primary) hypertension: Secondary | ICD-10-CM | POA: Diagnosis not present

## 2020-01-25 DIAGNOSIS — E7800 Pure hypercholesterolemia, unspecified: Secondary | ICD-10-CM | POA: Diagnosis not present

## 2020-02-24 DIAGNOSIS — E7800 Pure hypercholesterolemia, unspecified: Secondary | ICD-10-CM | POA: Diagnosis not present

## 2020-02-24 DIAGNOSIS — I10 Essential (primary) hypertension: Secondary | ICD-10-CM | POA: Diagnosis not present

## 2020-02-28 DIAGNOSIS — Z6823 Body mass index (BMI) 23.0-23.9, adult: Secondary | ICD-10-CM | POA: Diagnosis not present

## 2020-02-28 DIAGNOSIS — E7800 Pure hypercholesterolemia, unspecified: Secondary | ICD-10-CM | POA: Diagnosis not present

## 2020-02-28 DIAGNOSIS — Z299 Encounter for prophylactic measures, unspecified: Secondary | ICD-10-CM | POA: Diagnosis not present

## 2020-02-28 DIAGNOSIS — Z79899 Other long term (current) drug therapy: Secondary | ICD-10-CM | POA: Diagnosis not present

## 2020-02-28 DIAGNOSIS — Z1339 Encounter for screening examination for other mental health and behavioral disorders: Secondary | ICD-10-CM | POA: Diagnosis not present

## 2020-02-28 DIAGNOSIS — Z125 Encounter for screening for malignant neoplasm of prostate: Secondary | ICD-10-CM | POA: Diagnosis not present

## 2020-02-28 DIAGNOSIS — R5383 Other fatigue: Secondary | ICD-10-CM | POA: Diagnosis not present

## 2020-02-28 DIAGNOSIS — Z7189 Other specified counseling: Secondary | ICD-10-CM | POA: Diagnosis not present

## 2020-02-28 DIAGNOSIS — Z1331 Encounter for screening for depression: Secondary | ICD-10-CM | POA: Diagnosis not present

## 2020-02-28 DIAGNOSIS — Z0000 Encounter for general adult medical examination without abnormal findings: Secondary | ICD-10-CM | POA: Diagnosis not present

## 2020-02-28 DIAGNOSIS — I10 Essential (primary) hypertension: Secondary | ICD-10-CM | POA: Diagnosis not present

## 2020-03-01 DIAGNOSIS — Z299 Encounter for prophylactic measures, unspecified: Secondary | ICD-10-CM | POA: Diagnosis not present

## 2020-03-01 DIAGNOSIS — C439 Malignant melanoma of skin, unspecified: Secondary | ICD-10-CM | POA: Diagnosis not present

## 2020-03-01 DIAGNOSIS — N1832 Chronic kidney disease, stage 3b: Secondary | ICD-10-CM | POA: Diagnosis not present

## 2020-03-01 DIAGNOSIS — I10 Essential (primary) hypertension: Secondary | ICD-10-CM | POA: Diagnosis not present

## 2020-03-01 DIAGNOSIS — N400 Enlarged prostate without lower urinary tract symptoms: Secondary | ICD-10-CM | POA: Diagnosis not present

## 2020-03-05 DIAGNOSIS — I10 Essential (primary) hypertension: Secondary | ICD-10-CM | POA: Diagnosis not present

## 2020-03-05 DIAGNOSIS — E7800 Pure hypercholesterolemia, unspecified: Secondary | ICD-10-CM | POA: Diagnosis not present

## 2020-03-22 DIAGNOSIS — C439 Malignant melanoma of skin, unspecified: Secondary | ICD-10-CM | POA: Diagnosis not present

## 2020-03-22 DIAGNOSIS — N1832 Chronic kidney disease, stage 3b: Secondary | ICD-10-CM | POA: Diagnosis not present

## 2020-03-22 DIAGNOSIS — Z299 Encounter for prophylactic measures, unspecified: Secondary | ICD-10-CM | POA: Diagnosis not present

## 2020-03-22 DIAGNOSIS — I10 Essential (primary) hypertension: Secondary | ICD-10-CM | POA: Diagnosis not present

## 2020-03-23 DIAGNOSIS — Z85820 Personal history of malignant melanoma of skin: Secondary | ICD-10-CM | POA: Diagnosis not present

## 2020-03-23 DIAGNOSIS — Z951 Presence of aortocoronary bypass graft: Secondary | ICD-10-CM | POA: Diagnosis not present

## 2020-03-23 DIAGNOSIS — Z8261 Family history of arthritis: Secondary | ICD-10-CM | POA: Diagnosis not present

## 2020-03-23 DIAGNOSIS — E7800 Pure hypercholesterolemia, unspecified: Secondary | ICD-10-CM | POA: Diagnosis present

## 2020-03-23 DIAGNOSIS — N1832 Chronic kidney disease, stage 3b: Secondary | ICD-10-CM | POA: Diagnosis not present

## 2020-03-23 DIAGNOSIS — Z6825 Body mass index (BMI) 25.0-25.9, adult: Secondary | ICD-10-CM | POA: Diagnosis not present

## 2020-03-23 DIAGNOSIS — Z7982 Long term (current) use of aspirin: Secondary | ICD-10-CM | POA: Diagnosis not present

## 2020-03-23 DIAGNOSIS — Z8249 Family history of ischemic heart disease and other diseases of the circulatory system: Secondary | ICD-10-CM | POA: Diagnosis not present

## 2020-03-23 DIAGNOSIS — J90 Pleural effusion, not elsewhere classified: Secondary | ICD-10-CM | POA: Diagnosis not present

## 2020-03-23 DIAGNOSIS — Z781 Physical restraint status: Secondary | ICD-10-CM | POA: Diagnosis not present

## 2020-03-23 DIAGNOSIS — I129 Hypertensive chronic kidney disease with stage 1 through stage 4 chronic kidney disease, or unspecified chronic kidney disease: Secondary | ICD-10-CM | POA: Diagnosis present

## 2020-03-23 DIAGNOSIS — I5189 Other ill-defined heart diseases: Secondary | ICD-10-CM | POA: Diagnosis not present

## 2020-03-23 DIAGNOSIS — R079 Chest pain, unspecified: Secondary | ICD-10-CM | POA: Diagnosis not present

## 2020-03-23 DIAGNOSIS — I491 Atrial premature depolarization: Secondary | ICD-10-CM | POA: Diagnosis not present

## 2020-03-23 DIAGNOSIS — I447 Left bundle-branch block, unspecified: Secondary | ICD-10-CM | POA: Diagnosis present

## 2020-03-23 DIAGNOSIS — N189 Chronic kidney disease, unspecified: Secondary | ICD-10-CM | POA: Diagnosis not present

## 2020-03-23 DIAGNOSIS — Z833 Family history of diabetes mellitus: Secondary | ICD-10-CM | POA: Diagnosis not present

## 2020-03-23 DIAGNOSIS — Z20822 Contact with and (suspected) exposure to covid-19: Secondary | ICD-10-CM | POA: Diagnosis not present

## 2020-03-23 DIAGNOSIS — R7989 Other specified abnormal findings of blood chemistry: Secondary | ICD-10-CM | POA: Diagnosis not present

## 2020-03-23 DIAGNOSIS — E785 Hyperlipidemia, unspecified: Secondary | ICD-10-CM | POA: Diagnosis present

## 2020-03-23 DIAGNOSIS — I440 Atrioventricular block, first degree: Secondary | ICD-10-CM | POA: Diagnosis not present

## 2020-03-23 DIAGNOSIS — I10 Essential (primary) hypertension: Secondary | ICD-10-CM | POA: Diagnosis not present

## 2020-03-23 DIAGNOSIS — Z01818 Encounter for other preprocedural examination: Secondary | ICD-10-CM | POA: Diagnosis not present

## 2020-03-23 DIAGNOSIS — I2583 Coronary atherosclerosis due to lipid rich plaque: Secondary | ICD-10-CM | POA: Diagnosis present

## 2020-03-23 DIAGNOSIS — Z9911 Dependence on respirator [ventilator] status: Secondary | ICD-10-CM | POA: Diagnosis not present

## 2020-03-23 DIAGNOSIS — I4891 Unspecified atrial fibrillation: Secondary | ICD-10-CM | POA: Diagnosis not present

## 2020-03-23 DIAGNOSIS — Z01810 Encounter for preprocedural cardiovascular examination: Secondary | ICD-10-CM | POA: Diagnosis not present

## 2020-03-23 DIAGNOSIS — I081 Rheumatic disorders of both mitral and tricuspid valves: Secondary | ICD-10-CM | POA: Diagnosis present

## 2020-03-23 DIAGNOSIS — R0989 Other specified symptoms and signs involving the circulatory and respiratory systems: Secondary | ICD-10-CM | POA: Diagnosis not present

## 2020-03-23 DIAGNOSIS — I2510 Atherosclerotic heart disease of native coronary artery without angina pectoris: Secondary | ICD-10-CM | POA: Diagnosis present

## 2020-03-23 DIAGNOSIS — I214 Non-ST elevation (NSTEMI) myocardial infarction: Secondary | ICD-10-CM | POA: Diagnosis not present

## 2020-03-23 DIAGNOSIS — N183 Chronic kidney disease, stage 3 unspecified: Secondary | ICD-10-CM | POA: Diagnosis present

## 2020-03-23 DIAGNOSIS — Z825 Family history of asthma and other chronic lower respiratory diseases: Secondary | ICD-10-CM | POA: Diagnosis not present

## 2020-03-23 DIAGNOSIS — N400 Enlarged prostate without lower urinary tract symptoms: Secondary | ICD-10-CM | POA: Diagnosis present

## 2020-03-23 DIAGNOSIS — I454 Nonspecific intraventricular block: Secondary | ICD-10-CM | POA: Diagnosis not present

## 2020-03-23 DIAGNOSIS — I252 Old myocardial infarction: Secondary | ICD-10-CM | POA: Diagnosis not present

## 2020-03-23 DIAGNOSIS — I340 Nonrheumatic mitral (valve) insufficiency: Secondary | ICD-10-CM | POA: Diagnosis not present

## 2020-03-23 DIAGNOSIS — I459 Conduction disorder, unspecified: Secondary | ICD-10-CM | POA: Diagnosis not present

## 2020-03-23 DIAGNOSIS — I361 Nonrheumatic tricuspid (valve) insufficiency: Secondary | ICD-10-CM | POA: Diagnosis not present

## 2020-03-28 HISTORY — PX: CORONARY ARTERY BYPASS GRAFT: SHX141

## 2020-04-06 DIAGNOSIS — I10 Essential (primary) hypertension: Secondary | ICD-10-CM | POA: Diagnosis not present

## 2020-04-06 DIAGNOSIS — Z299 Encounter for prophylactic measures, unspecified: Secondary | ICD-10-CM | POA: Diagnosis not present

## 2020-04-06 DIAGNOSIS — C439 Malignant melanoma of skin, unspecified: Secondary | ICD-10-CM | POA: Diagnosis not present

## 2020-04-06 DIAGNOSIS — Z951 Presence of aortocoronary bypass graft: Secondary | ICD-10-CM | POA: Diagnosis not present

## 2020-04-06 DIAGNOSIS — I25119 Atherosclerotic heart disease of native coronary artery with unspecified angina pectoris: Secondary | ICD-10-CM | POA: Diagnosis not present

## 2020-04-06 DIAGNOSIS — L0390 Cellulitis, unspecified: Secondary | ICD-10-CM | POA: Diagnosis not present

## 2020-04-16 DIAGNOSIS — N183 Chronic kidney disease, stage 3 unspecified: Secondary | ICD-10-CM | POA: Diagnosis not present

## 2020-04-20 DIAGNOSIS — N184 Chronic kidney disease, stage 4 (severe): Secondary | ICD-10-CM | POA: Diagnosis not present

## 2020-04-20 DIAGNOSIS — L0390 Cellulitis, unspecified: Secondary | ICD-10-CM | POA: Diagnosis not present

## 2020-04-20 DIAGNOSIS — Z299 Encounter for prophylactic measures, unspecified: Secondary | ICD-10-CM | POA: Diagnosis not present

## 2020-04-20 DIAGNOSIS — I25119 Atherosclerotic heart disease of native coronary artery with unspecified angina pectoris: Secondary | ICD-10-CM | POA: Diagnosis not present

## 2020-04-20 DIAGNOSIS — I10 Essential (primary) hypertension: Secondary | ICD-10-CM | POA: Diagnosis not present

## 2020-05-03 DIAGNOSIS — Z7902 Long term (current) use of antithrombotics/antiplatelets: Secondary | ICD-10-CM | POA: Diagnosis not present

## 2020-05-03 DIAGNOSIS — Z48812 Encounter for surgical aftercare following surgery on the circulatory system: Secondary | ICD-10-CM | POA: Diagnosis not present

## 2020-05-03 DIAGNOSIS — Z7982 Long term (current) use of aspirin: Secondary | ICD-10-CM | POA: Diagnosis not present

## 2020-05-03 DIAGNOSIS — Z79899 Other long term (current) drug therapy: Secondary | ICD-10-CM | POA: Diagnosis not present

## 2020-05-25 DIAGNOSIS — E7800 Pure hypercholesterolemia, unspecified: Secondary | ICD-10-CM | POA: Diagnosis not present

## 2020-05-25 DIAGNOSIS — I10 Essential (primary) hypertension: Secondary | ICD-10-CM | POA: Diagnosis not present

## 2020-05-30 ENCOUNTER — Other Ambulatory Visit: Payer: Self-pay | Admitting: Nephrology

## 2020-05-30 ENCOUNTER — Other Ambulatory Visit (HOSPITAL_COMMUNITY): Payer: Self-pay | Admitting: Nephrology

## 2020-05-30 ENCOUNTER — Encounter: Payer: Self-pay | Admitting: Cardiology

## 2020-05-30 DIAGNOSIS — N281 Cyst of kidney, acquired: Secondary | ICD-10-CM

## 2020-05-30 DIAGNOSIS — E872 Acidosis: Secondary | ICD-10-CM | POA: Diagnosis not present

## 2020-05-30 DIAGNOSIS — N170 Acute kidney failure with tubular necrosis: Secondary | ICD-10-CM

## 2020-05-30 DIAGNOSIS — I129 Hypertensive chronic kidney disease with stage 1 through stage 4 chronic kidney disease, or unspecified chronic kidney disease: Secondary | ICD-10-CM | POA: Diagnosis not present

## 2020-05-30 DIAGNOSIS — Z79899 Other long term (current) drug therapy: Secondary | ICD-10-CM | POA: Diagnosis not present

## 2020-05-30 DIAGNOSIS — N1832 Chronic kidney disease, stage 3b: Secondary | ICD-10-CM

## 2020-05-30 DIAGNOSIS — I10 Essential (primary) hypertension: Secondary | ICD-10-CM

## 2020-05-30 DIAGNOSIS — I5022 Chronic systolic (congestive) heart failure: Secondary | ICD-10-CM | POA: Diagnosis not present

## 2020-05-30 NOTE — Progress Notes (Signed)
Cardiology Office Note  Date: 05/31/2020   ID: DAMEK ENDE, DOB [birth year], MRN [id number]  PCP:  Monico Blitz, MD  Cardiologist:  Rozann Lesches, MD Electrophysiologist:  None   Chief Complaint  Patient presents with  . Establish cardiology follow-up    History of Present Illness: Craig Forbes is a 74 y.o. male referred to establish cardiology follow-up by Dr. Manuella Ghazi.  I reviewed the available records and updated the chart.  His sister is a patient of mine.  He tells me that he has been doing well overall, has not been walking as much as he would like, but has had no angina symptoms, appetite is stable.  He brought in home blood pressure and heart rate checks, trend has been good over the last few weeks.  I reviewed his medications which are outlined below.  He had an office visit with his cardiothoracic surgeon at [facility] on October 7, I reviewed the note.  He was cleared to drive at that time.  He has had no obvious recurrence of atrial fibrillation since the postoperative period.  He has remained on amiodarone since then.  He had a visit with nephrology on November 3, I reviewed note from Dr. Theador Hawthorne.  He has CKD stage IIIb with acute renal insufficiency in the setting of ATN and cholesterol emboli, creatinine up to 2.2 by most recent assessment.  I reviewed his echocardiogram results and also his last lipid profile.  Past Medical History:  Diagnosis Date  . Anxiety   . Asthma   . BPH (benign prostatic hyperplasia)   . CAD (coronary artery disease)    Status post CABG with SVG to OM 2, SVG to ramus, and LIMA to LAD September 2021 [facility]  . Cardiomyopathy ([place])    LVEF 40-45% at time of CABG September 2021  . CKD (chronic kidney disease) stage 3, GFR 30-59 ml/min (HCC)   . Essential hypertension   . Melanoma ([place])   . NSTEMI (non-ST elevated myocardial infarction) [facility])    August 2021  . Postoperative atrial fibrillation [facility])     Past Surgical History:    Procedure Laterality Date  . COLONOSCOPY  03/11/2012   Procedure: COLONOSCOPY;  Surgeon: Rogene Houston, MD;  Location: [facility] ENDO SUITE;  Service: Endoscopy;  Laterality: N/A;  730  . CORONARY ARTERY BYPASS GRAFT  03/28/2020   [facility]    Current Outpatient Medications  Medication Sig Dispense Refill  . aspirin EC 81 MG tablet Take 81 mg by mouth daily.    [name] atorvastatin (LIPITOR) 80 MG tablet Take 80 mg by mouth daily.    . clopidogrel (PLAVIX) 75 MG tablet Take 75 mg by mouth daily.    . finasteride (PROSCAR) 5 MG tablet Take 5 mg by mouth daily.    [name] losartan (COZAAR) 25 MG tablet Take 25 mg by mouth daily.    . metoprolol tartrate (LOPRESSOR) 25 MG tablet Take 25 mg by mouth 2 (two) times daily.    . SENNA PO Take 50 mg by mouth.     No current facility-administered medications for this visit.   Allergies:  Patient has no known allergies.   Social History: The patient  reports that he has never smoked. He has never used smokeless tobacco. He reports that he does not drink alcohol and does not use drugs.   Family History: The patient's family history includes Arthritis in his father; Asthma in his mother; Cancer in his mother; Diabetes Mellitus II in  his mother; Heart disease in his father and mother; Hypertension in his mother.   ROS: No palpitations, dizziness, or syncope.  No fevers or chills.  No orthopnea or PND.  Physical Exam: VS:  BP (!) 148/82   Pulse 60   Ht 5\' 9"  (1.753 m)   Wt 158 lb (71.7 kg)   SpO2 98%   BMI 23.33 kg/m , BMI Body mass index is 23.33 kg/m.  Wt Readings from Last 3 Encounters:  05/31/20 158 lb (71.7 kg)    General: Patient appears comfortable at rest. HEENT: Conjunctiva and lids normal, wearing a mask. Neck: Supple, no elevated JVP or carotid bruits, no thyromegaly. Lungs: Clear to auscultation, nonlabored breathing at rest. Thorax: Well-healed sternal incision. Cardiac: Regular rate and rhythm, no S3 or significant systolic murmur, no  pericardial rub. Abdomen: Soft, nontender, bowel sounds present. Extremities: No pitting edema, well healed vein harvest site on the left. Skin: Warm and dry. Musculoskeletal: No kyphosis. Neuropsychiatric: Alert and oriented x3, affect grossly appropriate.  ECG:  Unable to personally review recent tracings from [facility].  Recent Labwork:  August 2021: Cholesterol 233, triglycerides 96, HDL 52, LDL 164, TSH 1.62 September 2021: Potassium 4.1, BUN 29, creatinine 1.92, hemoglobin 11.3, platelets 276  Other Studies Reviewed Today:  Echocardiogram 03/23/2020 [facility]): SUMMARY  Left ventricular systolic function is mildly reduced.  LV ejection fraction = 40-45%.  There is mild global hypokinesis of the left ventricle.  Left ventricular filling pattern is prolonged relaxation.  The right ventricle is normal in size and function.  The left atrium is mildly dilated.  There is mild mitral regurgitation.  There is mild aortic regurgitation.  IVC size was normal.  There is no pericardial effusion.  There is no significant change in comparison with the last study.   Cardiac catheterization 03/28/2020 [facility]): Cath shows severe CAD. LM has an 80% ostial stenosis. LAD has a 70% mid  stenosis. LCX has a 95% proximal stenosis. OM1 is 100% occluded. RCA has  mild non-obstructive CAD. With reduced EF and LM lesion, CABG is  recommended.   Assessment and Plan:  1.  Left main/multivessel CAD as noted above status post CABG in September at [facility].  He is recuperating well, I encouraged him to pursue a regular walking plan for exercise.  He does not describe any angina at this time.  Continue antiplatelet regimen, ARB, beta-blocker, and statin.  2.  Postoperative atrial fibrillation without obvious recurrences.  Heart rate is regular today.  Discontinue amiodarone and observe.  3.  Essential hypertension, currently on Cozaar and Lopressor.  Home blood pressure and heart rate control looks good.  No  changes were made today.  4.  CKD stage IIIb, recent creatinine 2.2.  Patient now following with Dr. Theador Hawthorne, additional testing pending at this point.  5.  Mixed hyperlipidemia, LDL 164 in August.  He is now on high-dose Lipitor.  Check fasting blood profile.  6.  Ischemic cardiomyopathy, LVEF 40 to 45% as of August.  Follow-up echocardiogram will be obtained now that he is status post revascularization and on medical therapy.  Medication Adjustments/Labs and Tests Ordered: Current medicines are reviewed at length with the patient today.  Concerns regarding medicines are outlined above.   Tests Ordered: Orders Placed This Encounter  Procedures  . Lipid panel  . ECHOCARDIOGRAM COMPLETE    Medication Changes: No orders of the defined types were placed in this encounter.   Disposition:  Follow up 3 months in the [place] office.  Signed, Satira Sark, MD, [facility] 05/31/2020 11:07 AM    [place] at [place], [place], [place] [address] Phone: [phone]; Fax: [phone]

## 2020-05-31 ENCOUNTER — Encounter: Payer: Self-pay | Admitting: Cardiology

## 2020-05-31 ENCOUNTER — Ambulatory Visit (INDEPENDENT_AMBULATORY_CARE_PROVIDER_SITE_OTHER): Payer: Medicare Other | Admitting: Cardiology

## 2020-05-31 VITALS — BP 148/82 | HR 60 | Ht 69.0 in | Wt 158.0 lb

## 2020-05-31 DIAGNOSIS — N1832 Chronic kidney disease, stage 3b: Secondary | ICD-10-CM

## 2020-05-31 DIAGNOSIS — I9789 Other postprocedural complications and disorders of the circulatory system, not elsewhere classified: Secondary | ICD-10-CM

## 2020-05-31 DIAGNOSIS — I429 Cardiomyopathy, unspecified: Secondary | ICD-10-CM

## 2020-05-31 DIAGNOSIS — E782 Mixed hyperlipidemia: Secondary | ICD-10-CM

## 2020-05-31 DIAGNOSIS — I25119 Atherosclerotic heart disease of native coronary artery with unspecified angina pectoris: Secondary | ICD-10-CM | POA: Diagnosis not present

## 2020-05-31 DIAGNOSIS — I4891 Unspecified atrial fibrillation: Secondary | ICD-10-CM | POA: Diagnosis not present

## 2020-05-31 NOTE — Patient Instructions (Addendum)
Medication Instructions:   Your physician has recommended you make the following change in your medication:   Stop amiodarone  Continue other medications the same  Labwork: Your physician recommends that you return for a FASTING lipid profile: as soon as possible. Please do not eat or drink for at least 8 hours when you have this done. You may take your medications that morning with a sip of water.  This may be done at [facility] or [facility] Monday-Friday from 8:00 am - 4:00 pm. No appointment is needed.  Testing/Procedures: Your physician has requested that you have an echocardiogram. Echocardiography is a painless test that uses sound waves to create images of your heart. It provides your doctor with information about the size and shape of your heart and how well your heart's chambers and valves are working. This procedure takes approximately one hour. There are no restrictions for this procedure.  Follow-Up:  Your physician recommends that you schedule a follow-up appointment in: 3 months.  Any Other Special Instructions Will Be Listed Below (If Applicable).  If you need a refill on your cardiac medications before your next appointment, please call your pharmacy.

## 2020-06-01 ENCOUNTER — Encounter: Payer: Self-pay | Admitting: Cardiology

## 2020-06-01 DIAGNOSIS — E782 Mixed hyperlipidemia: Secondary | ICD-10-CM | POA: Diagnosis not present

## 2020-06-01 DIAGNOSIS — I129 Hypertensive chronic kidney disease with stage 1 through stage 4 chronic kidney disease, or unspecified chronic kidney disease: Secondary | ICD-10-CM | POA: Diagnosis not present

## 2020-06-01 DIAGNOSIS — N1832 Chronic kidney disease, stage 3b: Secondary | ICD-10-CM | POA: Diagnosis not present

## 2020-06-01 DIAGNOSIS — Z79899 Other long term (current) drug therapy: Secondary | ICD-10-CM | POA: Diagnosis not present

## 2020-06-01 DIAGNOSIS — N170 Acute kidney failure with tubular necrosis: Secondary | ICD-10-CM | POA: Diagnosis not present

## 2020-06-01 DIAGNOSIS — N281 Cyst of kidney, acquired: Secondary | ICD-10-CM | POA: Diagnosis not present

## 2020-06-01 DIAGNOSIS — E872 Acidosis: Secondary | ICD-10-CM | POA: Diagnosis not present

## 2020-06-01 DIAGNOSIS — I5022 Chronic systolic (congestive) heart failure: Secondary | ICD-10-CM | POA: Diagnosis not present

## 2020-06-01 LAB — LIPID PANEL
Cholesterol: 109 mg/dL (ref <200)
HDL: 40 mg/dL (ref >=40)
LDL Cholesterol (Calc): 51 mg/dL (calc)
Non-HDL Cholesterol (Calc): 69 mg/dL (calc) (ref <130)
Total CHOL/HDL Ratio: 2.7 (calc) (ref <5.0)
Triglycerides: 98 mg/dL (ref <150)

## 2020-06-05 ENCOUNTER — Telehealth: Payer: Self-pay | Admitting: *Deleted

## 2020-06-05 NOTE — Telephone Encounter (Signed)
Patient informed. Copy sent to PCP °

## 2020-06-05 NOTE — Telephone Encounter (Signed)
-----   Message from Satira Sark, MD sent at 06/02/2020  7:46 AM EDT ----- Results reviewed.  Follow-up LDL much better at 51 on high-dose Lipitor.  Continue same.

## 2020-06-07 ENCOUNTER — Other Ambulatory Visit: Payer: Self-pay

## 2020-06-07 ENCOUNTER — Ambulatory Visit (HOSPITAL_COMMUNITY)
Admission: RE | Admit: 2020-06-07 | Discharge: 2020-06-07 | Disposition: A | Discharge status: Home / Self Care | Payer: Medicare Other | Source: Ambulatory Visit | Attending: Nephrology | Admitting: Nephrology

## 2020-06-07 DIAGNOSIS — Z85828 Personal history of other malignant neoplasm of skin: Secondary | ICD-10-CM | POA: Diagnosis not present

## 2020-06-07 DIAGNOSIS — D225 Melanocytic nevi of trunk: Secondary | ICD-10-CM | POA: Diagnosis not present

## 2020-06-07 DIAGNOSIS — L918 Other hypertrophic disorders of the skin: Secondary | ICD-10-CM | POA: Diagnosis not present

## 2020-06-07 DIAGNOSIS — N281 Cyst of kidney, acquired: Secondary | ICD-10-CM | POA: Diagnosis not present

## 2020-06-07 DIAGNOSIS — D1801 Hemangioma of skin and subcutaneous tissue: Secondary | ICD-10-CM | POA: Diagnosis not present

## 2020-06-07 DIAGNOSIS — N200 Calculus of kidney: Secondary | ICD-10-CM | POA: Diagnosis not present

## 2020-06-07 DIAGNOSIS — Z85820 Personal history of malignant melanoma of skin: Secondary | ICD-10-CM | POA: Diagnosis not present

## 2020-06-07 DIAGNOSIS — I10 Essential (primary) hypertension: Secondary | ICD-10-CM

## 2020-06-07 DIAGNOSIS — N170 Acute kidney failure with tubular necrosis: Secondary | ICD-10-CM

## 2020-06-07 DIAGNOSIS — N1832 Chronic kidney disease, stage 3b: Secondary | ICD-10-CM

## 2020-06-07 DIAGNOSIS — L821 Other seborrheic keratosis: Secondary | ICD-10-CM | POA: Diagnosis not present

## 2020-06-20 DIAGNOSIS — I25119 Atherosclerotic heart disease of native coronary artery with unspecified angina pectoris: Secondary | ICD-10-CM | POA: Diagnosis not present

## 2020-06-20 DIAGNOSIS — I10 Essential (primary) hypertension: Secondary | ICD-10-CM | POA: Diagnosis not present

## 2020-06-20 DIAGNOSIS — N184 Chronic kidney disease, stage 4 (severe): Secondary | ICD-10-CM | POA: Diagnosis not present

## 2020-06-20 DIAGNOSIS — Z299 Encounter for prophylactic measures, unspecified: Secondary | ICD-10-CM | POA: Diagnosis not present

## 2020-06-20 DIAGNOSIS — C439 Malignant melanoma of skin, unspecified: Secondary | ICD-10-CM | POA: Diagnosis not present

## 2020-06-26 DIAGNOSIS — I10 Essential (primary) hypertension: Secondary | ICD-10-CM | POA: Diagnosis not present

## 2020-06-26 DIAGNOSIS — E7800 Pure hypercholesterolemia, unspecified: Secondary | ICD-10-CM | POA: Diagnosis not present

## 2020-06-28 DIAGNOSIS — N1832 Chronic kidney disease, stage 3b: Secondary | ICD-10-CM | POA: Diagnosis not present

## 2020-06-28 DIAGNOSIS — N200 Calculus of kidney: Secondary | ICD-10-CM | POA: Diagnosis not present

## 2020-06-28 DIAGNOSIS — N170 Acute kidney failure with tubular necrosis: Secondary | ICD-10-CM | POA: Diagnosis not present

## 2020-06-28 DIAGNOSIS — E611 Iron deficiency: Secondary | ICD-10-CM | POA: Diagnosis not present

## 2020-06-28 DIAGNOSIS — I5022 Chronic systolic (congestive) heart failure: Secondary | ICD-10-CM | POA: Diagnosis not present

## 2020-06-28 DIAGNOSIS — E559 Vitamin D deficiency, unspecified: Secondary | ICD-10-CM | POA: Diagnosis not present

## 2020-07-03 ENCOUNTER — Ambulatory Visit (INDEPENDENT_AMBULATORY_CARE_PROVIDER_SITE_OTHER): Payer: Medicare Other

## 2020-07-03 DIAGNOSIS — I25119 Atherosclerotic heart disease of native coronary artery with unspecified angina pectoris: Secondary | ICD-10-CM | POA: Diagnosis not present

## 2020-07-03 DIAGNOSIS — I429 Cardiomyopathy, unspecified: Secondary | ICD-10-CM

## 2020-07-03 LAB — ECHOCARDIOGRAM COMPLETE
AR max vel: 2.25 cm2
AV Area VTI: 2.18 cm2
AV Area mean vel: 2.06 cm2
AV Mean grad: 2 mmHg
AV Peak grad: 3.3 mmHg
Ao pk vel: 0.9 m/s
Area-P 1/2: 1.78 cm2
Calc EF: 43.4 %
MV M vel: 3.15 m/s
MV Peak grad: 39.7 mmHg
P 1/2 time: 2833 msec
S' Lateral: 3.63 cm
Single Plane A2C EF: 47.6 %
Single Plane A4C EF: 45.1 %

## 2020-07-05 ENCOUNTER — Telehealth: Payer: Self-pay | Admitting: *Deleted

## 2020-07-05 NOTE — Telephone Encounter (Signed)
-----   Message from Satira Sark, MD sent at 07/03/2020  5:32 PM EST ----- Results reviewed.  LVEF 45 to 50% range, improved somewhat compared to prior assessment at [place] with current medications and scheduled follow-up plan.

## 2020-07-06 NOTE — Telephone Encounter (Signed)
Patient informed. Copy sent to PCP °

## 2020-07-26 DIAGNOSIS — E7800 Pure hypercholesterolemia, unspecified: Secondary | ICD-10-CM | POA: Diagnosis not present

## 2020-07-26 DIAGNOSIS — I10 Essential (primary) hypertension: Secondary | ICD-10-CM | POA: Diagnosis not present

## 2020-08-06 ENCOUNTER — Encounter (INDEPENDENT_AMBULATORY_CARE_PROVIDER_SITE_OTHER): Payer: Self-pay | Admitting: *Deleted

## 2020-08-23 DIAGNOSIS — N1832 Chronic kidney disease, stage 3b: Secondary | ICD-10-CM | POA: Diagnosis not present

## 2020-08-23 DIAGNOSIS — E559 Vitamin D deficiency, unspecified: Secondary | ICD-10-CM | POA: Diagnosis not present

## 2020-08-23 DIAGNOSIS — N170 Acute kidney failure with tubular necrosis: Secondary | ICD-10-CM | POA: Diagnosis not present

## 2020-08-23 DIAGNOSIS — E611 Iron deficiency: Secondary | ICD-10-CM | POA: Diagnosis not present

## 2020-08-23 DIAGNOSIS — I5022 Chronic systolic (congestive) heart failure: Secondary | ICD-10-CM | POA: Diagnosis not present

## 2020-08-30 DIAGNOSIS — I5022 Chronic systolic (congestive) heart failure: Secondary | ICD-10-CM | POA: Diagnosis not present

## 2020-08-30 DIAGNOSIS — E559 Vitamin D deficiency, unspecified: Secondary | ICD-10-CM | POA: Diagnosis not present

## 2020-08-30 DIAGNOSIS — N1832 Chronic kidney disease, stage 3b: Secondary | ICD-10-CM | POA: Diagnosis not present

## 2020-08-30 DIAGNOSIS — I129 Hypertensive chronic kidney disease with stage 1 through stage 4 chronic kidney disease, or unspecified chronic kidney disease: Secondary | ICD-10-CM | POA: Diagnosis not present

## 2020-09-05 ENCOUNTER — Telehealth (INDEPENDENT_AMBULATORY_CARE_PROVIDER_SITE_OTHER): Payer: Self-pay

## 2020-09-05 ENCOUNTER — Encounter (INDEPENDENT_AMBULATORY_CARE_PROVIDER_SITE_OTHER): Payer: Self-pay | Admitting: Gastroenterology

## 2020-09-05 ENCOUNTER — Encounter (INDEPENDENT_AMBULATORY_CARE_PROVIDER_SITE_OTHER): Payer: Self-pay

## 2020-09-05 ENCOUNTER — Other Ambulatory Visit (INDEPENDENT_AMBULATORY_CARE_PROVIDER_SITE_OTHER): Payer: Self-pay

## 2020-09-05 ENCOUNTER — Ambulatory Visit (INDEPENDENT_AMBULATORY_CARE_PROVIDER_SITE_OTHER): Payer: Medicare Other | Admitting: Gastroenterology

## 2020-09-05 ENCOUNTER — Other Ambulatory Visit: Payer: Self-pay

## 2020-09-05 DIAGNOSIS — E611 Iron deficiency: Secondary | ICD-10-CM

## 2020-09-05 DIAGNOSIS — Z1211 Encounter for screening for malignant neoplasm of colon: Secondary | ICD-10-CM

## 2020-09-05 MED ORDER — NA SULFATE-K SULFATE-MG SULF 17.5-3.13-1.6 GM/177ML PO SOLN: 354.0000 mL | Freq: Once | ORAL | 0 refills | Status: AC

## 2020-09-05 NOTE — Telephone Encounter (Signed)
Craig Forbes will be under going a Colonoscopy & Upper Endoscopy with Dr [name] on 09/21/20 is it advisable for him to hold his Plavix 5 days prior to his procedure, Please advise?

## 2020-09-05 NOTE — Telephone Encounter (Deleted)
Kilroy, Luke K, PA-C  Cv Div Preop Callback 3 hours ago (11:04 AM)     We need an official clearance request on this patient.   Kerin Ransom PA-C  09/05/2020  11:04 AM   Routing comment

## 2020-09-05 NOTE — Telephone Encounter (Signed)
Craig Forbes, CMA  

## 2020-09-05 NOTE — H&P (View-Only) (Signed)
[name], M.D. Gastroenterology & Hepatology [facility] [address] [place], [place] [address] Primary Care Physician: Monico Blitz, [place] [address]  Referring MD: Dr. Lavina Hamman. [place]  Chief Complaint: Iron deficiency  History of Present Illness: JAQUAVIAN FIRKUS is a 75 y.o. male with PMH CKD 3, NSTEMI s/p CABG in 03/2020, melanoma s/p resection, asthma, BPH, anxiety, heart failure with most recent ejection fraction 45%, hypertension, who presents for evaluation of iron deficiency.  Patient was referred by Dr. Theador Hawthorne for evaluation of iron deficiency. He states that he was found to have this ulceration during routine blood work-up but did not have any complaints. Patient noted his stool became darker after he started taking oral iron 375 mg TID recently, which he has been able to tolerate. However, he did not have any melena or hematochezia prior to starting this medication. The patient denies having any nausea, vomiting, fever, chills, hematemesis, abdominal distention, abdominal pain, diarrhea, jaundice, pruritus or weight loss.  He currently takes aspirin and Plavix 75 mg qday. His Plavix was started after he had his NSTEMI episode. He does not take NSAIDs or other AC.  Upon review of his blood work-up, labs from 06/01/2020 showed normal iron of 64 but low iron saturation of 16%, TIBC was 393 and ferritin was 48. Notably his CBC showed a hemoglobin of 14.6 with MCV of 92.1, platelets were normal at 235 and also, was 6.2.  Last BTD:VVOHY Last Colonoscopy:12/2011 - small polyp in splenic flexure, diverticulosis  In sigmoid, hemorrhoids  FHx: neg for any gastrointestinal/liver disease, mother breast cancer Social: neg smoking, alcohol or illicit drug use Surgical: no abdominal surgeries  Past Medical History: Past Medical History:  Diagnosis Date  . Anxiety   . Asthma   . BPH (benign prostatic  hyperplasia)   . CAD (coronary artery disease)    Status post CABG with SVG to OM 2, SVG to ramus, and LIMA to LAD September 2021 [facility]  . Cardiomyopathy ([place])    LVEF 40-45% at time of CABG September 2021  . CKD (chronic kidney disease) stage 3, GFR 30-59 ml/min (HCC)   . Essential hypertension   . Melanoma ([place])   . NSTEMI (non-ST elevated myocardial infarction) [facility])    August 2021  . Postoperative atrial fibrillation [facility])     Past Surgical History: Past Surgical History:  Procedure Laterality Date  . COLONOSCOPY  03/11/2012   Procedure: COLONOSCOPY;  Surgeon: Rogene Houston, MD;  Location: [facility] ENDO SUITE;  Service: Endoscopy;  Laterality: N/A;  730  . CORONARY ARTERY BYPASS GRAFT  03/28/2020   [place]    Family History: Family History  Problem Relation Age of Onset  . Asthma Mother   . Cancer Mother   . Diabetes Mellitus II Mother   . Heart disease Mother   . Hypertension Mother   . Arthritis Father   . Heart disease Father     Social History: Social History   Tobacco Use  Smoking Status Never Smoker  Smokeless Tobacco Never Used   Social History   Substance and Sexual Activity  Alcohol Use No   Social History   Substance and Sexual Activity  Drug Use No    Allergies: No Known Allergies  Medications: Current Outpatient Medications  Medication Sig Dispense Refill  . aspirin EC 81 MG tablet Take 81 mg by mouth daily.    [name] atorvastatin (LIPITOR) 80 MG tablet Take 80 mg by  mouth daily.    . Cholecalciferol 25 MCG (1000 UT) CHEW Chew 25 Units by mouth daily.    . clopidogrel (PLAVIX) 75 MG tablet Take 75 mg by mouth daily.    . ferrous sulfate 324 MG TBEC Take 324 mg by mouth 3 (three) times daily.    . finasteride (PROSCAR) 5 MG tablet Take 5 mg by mouth daily.    [name] losartan (COZAAR) 25 MG tablet Take 25 mg by mouth daily.    . metoprolol tartrate (LOPRESSOR) 25 MG tablet Take 25 mg by mouth 2 (two) times daily.    . SENNA PO Take 50 mg by mouth 2  (two) times daily.     No current facility-administered medications for this visit.    Review of Systems: GENERAL: negative for malaise, night sweats HEENT: No changes in hearing or vision, no nose bleeds or other nasal problems. NECK: Negative for lumps, goiter, pain and significant neck swelling RESPIRATORY: Negative for cough, wheezing CARDIOVASCULAR: Negative for chest pain, leg swelling, palpitations, orthopnea GI: SEE HPI MUSCULOSKELETAL: Negative for joint pain or swelling, back pain, and muscle pain. SKIN: Negative for lesions, rash PSYCH: Negative for sleep disturbance, mood disorder and recent psychosocial stressors. HEMATOLOGY Negative for prolonged bleeding, bruising easily, and swollen nodes. ENDOCRINE: Negative for cold or heat intolerance, polyuria, polydipsia and goiter. NEURO: negative for tremor, gait imbalance, syncope and seizures. The remainder of the review of systems is noncontributory.   Physical Exam: BP (!) 153/87 (BP Location: Left Arm, Patient Position: Sitting, Cuff Size: Large)   Pulse 64   Temp 98.2 F (36.8 C) (Oral)   Ht 5\' 10"  (1.778 m)   Wt 167 lb (75.8 kg)   BMI 23.96 kg/m  GENERAL: The patient is AO x3, in no acute distress. HEENT: Head is normocephalic and atraumatic. EOMI are intact. Mouth is well hydrated and without lesions. NECK: Supple. No masses LUNGS: Clear to auscultation. No presence of rhonchi/wheezing/rales. Adequate chest expansion HEART: RRR, normal s1 and s2. ABDOMEN: Soft, nontender, no guarding, no peritoneal signs, and nondistended. BS +. No masses. EXTREMITIES: Without any cyanosis, clubbing, rash, lesions or edema. NEUROLOGIC: AOx3, no focal motor deficit. SKIN: no jaundice, no rashes   Imaging/Labs: as above  I personally reviewed and interpreted the available labs, imaging and endoscopic files.  Impression and Plan: CAILLOU MINUS is a 75 y.o. male with PMH CKD 3, NSTEMI s/p CABG in 03/2020, melanoma s/p  resection, asthma, BPH, anxiety, heart failure with most recent ejection fraction 45%, hypertension, who presents for evaluation of iron deficiency. Patient has not presented any episodes of overt gastrointestinal bleeding. However he had evidence of mild decrease in his iron stores which will require an endoscopic evaluation. His last colonoscopy was performed 9 years ago but he was found to have a polyp, I do not have any pathology available but will perform surveillance at the same time I perform evaluation of his iron deficiency. 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 If there is no evidence of any lesion that explains his mild iron deficiency during EGD or colonoscopy, we will observe if this worsens before proceeding with a capsule endoscopy.  - Schedule EGD and colonoscopy,will obtain clearance form Dr. Domenic Polite to stop Plavix prior to procedure - Continue oral iron supplementation  All questions were answered.      [name], MD Gastroenterology and Hepatology [facility]

## 2020-09-05 NOTE — Patient Instructions (Signed)
Schedule EGD with SB biopsies and colonoscopy, will obtain clearance form Dr. Domenic Polite to stop Plavix prior to procedure

## 2020-09-05 NOTE — Telephone Encounter (Signed)
There is a formal pre op request procedure we use to clear patients for surgery and anticoagulation questions. You may not be familiar with it.   I'll ask my nurse to help you with this.  I have already sent this to Dr Domenic Polite- we are awaiting his response.  Kerin Ransom PA-C 09/05/2020 3:24 PM

## 2020-09-05 NOTE — Telephone Encounter (Signed)
Routing to Dr Domenic Polite as well

## 2020-09-05 NOTE — Telephone Encounter (Signed)
Thanks.  Lurena Joiner, can your staff schedule an appointment to obtain this clearance? We will postpone the EGD/colonoscopy until clearance is obtained. Thanks, [name], MD Gastroenterology and Hepatology [facility]

## 2020-09-05 NOTE — Telephone Encounter (Signed)
Please see the chart.  I met this patient in November 2021.  He had a NSTEMI in August 2021 and underwent CABG in September 2021 at [facility].  Reason for Plavix is the NSTEMI, not stent intervention.  In that case, may hold Plavix as requested prior to colonoscopy.

## 2020-09-05 NOTE — Progress Notes (Signed)
Craig Forbes, M.D. Gastroenterology & Hepatology [facility] [address] [place], Craig Forbes [address] Primary Care Physician: Monico Blitz, [place] [address]  Referring MD: Dr. Lavina Hamman. [place]  Chief Complaint: Iron deficiency  History of Present Illness: Craig Forbes is a 75 y.o. male with PMH CKD 3, NSTEMI s/p CABG in 03/2020, melanoma s/p resection, asthma, BPH, anxiety, heart failure with most recent ejection fraction 45%, hypertension, who presents for evaluation of iron deficiency.  Patient was referred by Dr. Theador Hawthorne for evaluation of iron deficiency. He states that he was found to have this ulceration during routine blood work-up but did not have any complaints. Patient noted his stool became darker after he started taking oral iron 375 mg TID recently, which he has been able to tolerate. However, he did not have any melena or hematochezia prior to starting this medication. The patient denies having any nausea, vomiting, fever, chills, hematemesis, abdominal distention, abdominal pain, diarrhea, jaundice, pruritus or weight loss.  He currently takes aspirin and Plavix 75 mg qday. His Plavix was started after he had his NSTEMI episode. He does not take NSAIDs or other AC.  Upon review of his blood work-up, labs from 06/01/2020 showed normal iron of 64 but low iron saturation of 16%, TIBC was 393 and ferritin was 48. Notably his CBC showed a hemoglobin of 14.6 with MCV of 92.1, platelets were normal at 235 and also, was 6.2.  Last VZD:GLOVF Last Colonoscopy:12/2011 - small polyp in splenic flexure, diverticulosis  In sigmoid, hemorrhoids  FHx: neg for any gastrointestinal/liver disease, mother breast cancer Social: neg smoking, alcohol or illicit drug use Surgical: no abdominal surgeries  Past Medical History: Past Medical History:  Diagnosis Date  . Anxiety   . Asthma   . BPH (benign prostatic  hyperplasia)   . CAD (coronary artery disease)    Status post CABG with SVG to OM 2, SVG to ramus, and LIMA to LAD September 2021 [facility]  . Cardiomyopathy ([place])    LVEF 40-45% at time of CABG September 2021  . CKD (chronic kidney disease) stage 3, GFR 30-59 ml/min (HCC)   . Essential hypertension   . Melanoma ([place])   . NSTEMI (non-ST elevated myocardial infarction) [facility])    August 2021  . Postoperative atrial fibrillation [facility])     Past Surgical History: Past Surgical History:  Procedure Laterality Date  . COLONOSCOPY  03/11/2012   Procedure: COLONOSCOPY;  Surgeon: Rogene Houston, MD;  Location: [facility] ENDO SUITE;  Service: Endoscopy;  Laterality: N/A;  730  . CORONARY ARTERY BYPASS GRAFT  03/28/2020   Craig Forbes    Family History: Family History  Problem Relation Age of Onset  . Asthma Mother   . Cancer Mother   . Diabetes Mellitus II Mother   . Heart disease Mother   . Hypertension Mother   . Arthritis Father   . Heart disease Father     Social History: Social History   Tobacco Use  Smoking Status Never Smoker  Smokeless Tobacco Never Used   Social History   Substance and Sexual Activity  Alcohol Use No   Social History   Substance and Sexual Activity  Drug Use No    Allergies: No Known Allergies  Medications: Current Outpatient Medications  Medication Sig Dispense Refill  . aspirin EC 81 MG tablet Take 81 mg by mouth daily.    [name] atorvastatin (LIPITOR) 80 MG tablet Take 80 mg by  mouth daily.    . Cholecalciferol 25 MCG (1000 UT) CHEW Chew 25 Units by mouth daily.    . clopidogrel (PLAVIX) 75 MG tablet Take 75 mg by mouth daily.    . ferrous sulfate 324 MG TBEC Take 324 mg by mouth 3 (three) times daily.    . finasteride (PROSCAR) 5 MG tablet Take 5 mg by mouth daily.    [name] losartan (COZAAR) 25 MG tablet Take 25 mg by mouth daily.    . metoprolol tartrate (LOPRESSOR) 25 MG tablet Take 25 mg by mouth 2 (two) times daily.    . SENNA PO Take 50 mg by mouth 2  (two) times daily.     No current facility-administered medications for this visit.    Review of Systems: GENERAL: negative for malaise, night sweats HEENT: No changes in hearing or vision, no nose bleeds or other nasal problems. NECK: Negative for lumps, goiter, pain and significant neck swelling RESPIRATORY: Negative for cough, wheezing CARDIOVASCULAR: Negative for chest pain, leg swelling, palpitations, orthopnea GI: SEE HPI MUSCULOSKELETAL: Negative for joint pain or swelling, back pain, and muscle pain. SKIN: Negative for lesions, rash PSYCH: Negative for sleep disturbance, mood disorder and recent psychosocial stressors. HEMATOLOGY Negative for prolonged bleeding, bruising easily, and swollen nodes. ENDOCRINE: Negative for cold or heat intolerance, polyuria, polydipsia and goiter. NEURO: negative for tremor, gait imbalance, syncope and seizures. The remainder of the review of systems is noncontributory.   Physical Exam: BP (!) 153/87 (BP Location: Left Arm, Patient Position: Sitting, Cuff Size: Large)   Pulse 64   Temp 98.2 F (36.8 C) (Oral)   Ht 5\' 10"  (1.778 m)   Wt 167 lb (75.8 kg)   BMI 23.96 kg/m  GENERAL: The patient is AO x3, in no acute distress. HEENT: Head is normocephalic and atraumatic. EOMI are intact. Mouth is well hydrated and without lesions. NECK: Supple. No masses LUNGS: Clear to auscultation. No presence of rhonchi/wheezing/rales. Adequate chest expansion HEART: RRR, normal s1 and s2. ABDOMEN: Soft, nontender, no guarding, no peritoneal signs, and nondistended. BS +. No masses. EXTREMITIES: Without any cyanosis, clubbing, rash, lesions or edema. NEUROLOGIC: AOx3, no focal motor deficit. SKIN: no jaundice, no rashes   Imaging/Labs: as above  I personally reviewed and interpreted the available labs, imaging and endoscopic files.  Impression and Plan: Craig Forbes is a 75 y.o. male with PMH CKD 3, NSTEMI s/p CABG in 03/2020, melanoma s/p  resection, asthma, BPH, anxiety, heart failure with most recent ejection fraction 45%, hypertension, who presents for evaluation of iron deficiency. Patient has not presented any episodes of overt gastrointestinal bleeding. However he had evidence of mild decrease in his iron stores which will require an endoscopic evaluation. His last colonoscopy was performed 9 years ago but he was found to have a polyp, I do not have any pathology available but will perform surveillance at the same time I perform evaluation of his iron deficiency. 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 If there is no evidence of any lesion that explains his mild iron deficiency during EGD or colonoscopy, we will observe if this worsens before proceeding with a capsule endoscopy.  - Schedule EGD and colonoscopy,will obtain clearance form Dr. Domenic Polite to stop Plavix prior to procedure - Continue oral iron supplementation  All questions were answered.      Craig Peppers, MD Gastroenterology and Hepatology [facility]

## 2020-09-05 NOTE — Telephone Encounter (Signed)
Dr Domenic Polite we need clearance to hold Plavix in this patient prior to colonoscopy and endoscopy.  He is s/p CABG Sept 2021.  Please respond to CV DIV PRE OP  Thanks  Kerin Ransom PA-C 09/05/2020 3:18 PM

## 2020-09-06 NOTE — Telephone Encounter (Signed)
Thanks for your recommendations

## 2020-09-06 NOTE — Telephone Encounter (Signed)
   Primary Cardiologist: Rozann Lesches, MD  Chart reviewed as part of pre-operative protocol coverage. Patient was contacted 09/06/2020 in reference to pre-operative risk assessment for pending surgery as outlined below.  Craig Forbes was last seen on Dr. Domenic Polite 05/2020 with CAD s/p CABG 03/2020 (on Plavix due to NSTEMI), anxiety, asthma, CKD, cardiomyopathy with EF 40-45% at time of bypass, HTN, postop Afib. Last EF had improved slightly to 45-50% range compared to prior. Spoke with patient who affirms he is doing well from cardiac standpoint without any new concerns. Therefore, based on ACC/AHA guidelines, the patient would be at acceptable risk for the planned procedure without further cardiovascular testing.   Per Dr. Domenic Polite, patient may hold his Plavix for 5 days prior to GI procedure.  The patient was advised that if he develops new symptoms prior to surgery to contact our office to arrange for a follow-up visit, and he verbalized understanding.  I will route this recommendation to the requesting party Dr. Jenetta Downer via Epic since it came through as a message that way. Please call with questions.   Charlie Pitter, PA-C 09/06/2020, 9:18 AM

## 2020-09-10 ENCOUNTER — Encounter (INDEPENDENT_AMBULATORY_CARE_PROVIDER_SITE_OTHER): Payer: Self-pay

## 2020-09-17 NOTE — Patient Instructions (Signed)
Craig Forbes  09/17/2020     @PREFPERIOPPHARMACY @   Your procedure is scheduled on 09/21/2020.   Report to [facility] at  [place].M.   Call this number if you have problems the morning of surgery:  [phone]   Remember:  Follow the diet and prep instructions given to you by the office.                      Take these medicines the morning of surgery with A SIP OF WATER  Metoprolol, proscar.    Please brush your teeth.  Do not wear jewelry, make-up or nail polish.  Do not wear lotions, powders, or perfumes, or deodorant.  Do not shave 48 hours prior to surgery.  Men may shave face and neck.  Do not bring valuables to the hospital.  [facility] is not responsible for any belongings or valuables.  Contacts, dentures or bridgework may not be worn into surgery.  Leave your suitcase in the car.  After surgery it may be brought to your room.  For patients admitted to the hospital, discharge time will be determined by your treatment team.  Patients discharged the day of surgery will not be allowed to drive home and must have someone with them for 24 hours.   Special instructions:  DO NOT smoke tobacco or vape the morning of your procedure.   Please read over the following fact sheets that you were given. Anesthesia Post-op Instructions and Care and Recovery After Surgery       Upper Endoscopy, Adult, Care After This sheet gives you information about how to care for yourself after your procedure. Your health care provider may also give you more specific instructions. If you have problems or questions, contact your health care provider. What can I expect after the procedure? After the procedure, it is common to have:  A sore throat.  Mild stomach pain or discomfort.  Bloating.  Nausea. Follow these instructions at home:  Follow instructions from your health care provider about what to eat or drink after your procedure.  Return to your normal  activities as told by your health care provider. Ask your health care provider what activities are safe for you.  Take over-the-counter and prescription medicines only as told by your health care provider.  If you were given a sedative during the procedure, it can affect you for several hours. Do not drive or operate machinery until your health care provider says that it is safe.  Keep all follow-up visits as told by your health care provider. This is important.   Contact a health care provider if you have:  A sore throat that lasts longer than one day.  Trouble swallowing. Get help right away if:  You vomit blood or your vomit looks like coffee grounds.  You have: ? A fever. ? Bloody, black, or tarry stools. ? A severe sore throat or you cannot swallow. ? Difficulty breathing. ? Severe pain in your chest or abdomen. Summary  After the procedure, it is common to have a sore throat, mild stomach discomfort, bloating, and nausea.  If you were given a sedative during the procedure, it can affect you for several hours. Do not drive or operate machinery until your health care provider says that it is safe.  Follow instructions from your health care provider about what to eat or drink after your procedure.  Return to your normal activities  as told by your health care provider. This information is not intended to replace advice given to you by your health care provider. Make sure you discuss any questions you have with your health care provider. Document Revised: 07/12/2019 Document Reviewed: 12/14/2017 Elsevier Patient Education  2021 August.  Colonoscopy, Adult, Care After This sheet gives you information about how to care for yourself after your procedure. Your health care provider may also give you more specific instructions. If you have problems or questions, contact your health care provider. What can I expect after the procedure? After the procedure, it is common to  have:  A small amount of blood in your stool for 24 hours after the procedure.  Some gas.  Mild cramping or bloating of your abdomen. Follow these instructions at home: Eating and drinking  Drink enough fluid to keep your urine pale yellow.  Follow instructions from your health care provider about eating or drinking restrictions.  Resume your normal diet as instructed by your health care provider. Avoid heavy or fried foods that are hard to digest.   Activity  Rest as told by your health care provider.  Avoid sitting for a long time without moving. Get up to take short walks every 1-2 hours. This is important to improve blood flow and breathing. Ask for help if you feel weak or unsteady.  Return to your normal activities as told by your health care provider. Ask your health care provider what activities are safe for you. Managing cramping and bloating  Try walking around when you have cramps or feel bloated.  Apply heat to your abdomen as told by your health care provider. Use the heat source that your health care provider recommends, such as a moist heat pack or a heating pad. ? Place a towel between your skin and the heat source. ? Leave the heat on for 20-30 minutes. ? Remove the heat if your skin turns bright red. This is especially important if you are unable to feel pain, heat, or cold. You may have a greater risk of getting burned.   General instructions  If you were given a sedative during the procedure, it can affect you for several hours. Do not drive or operate machinery until your health care provider says that it is safe.  For the first 24 hours after the procedure: ? Do not sign important documents. ? Do not drink alcohol. ? Do your regular daily activities at a slower pace than normal. ? Eat soft foods that are easy to digest.  Take over-the-counter and prescription medicines only as told by your health care provider.  Keep all follow-up visits as told by your  health care provider. This is important. Contact a health care provider if:  You have blood in your stool 2-3 days after the procedure. Get help right away if you have:  More than a small spotting of blood in your stool.  Large blood clots in your stool.  Swelling of your abdomen.  Nausea or vomiting.  A fever.  Increasing pain in your abdomen that is not relieved with medicine. Summary  After the procedure, it is common to have a small amount of blood in your stool. You may also have mild cramping and bloating of your abdomen.  If you were given a sedative during the procedure, it can affect you for several hours. Do not drive or operate machinery until your health care provider says that it is safe.  Get help right away if  you have a lot of blood in your stool, nausea or vomiting, a fever, or increased pain in your abdomen. This information is not intended to replace advice given to you by your health care provider. Make sure you discuss any questions you have with your health care provider. Document Revised: 07/08/2019 Document Reviewed: 02/07/2019 Elsevier Patient Education  2021 [place] After This sheet gives you information about how to care for yourself after your procedure. Your health care provider may also give you more specific instructions. If you have problems or questions, contact your health care provider. What can I expect after the procedure? After the procedure, it is common to have:  Tiredness.  Forgetfulness about what happened after the procedure.  Impaired judgment for important decisions.  Nausea or vomiting.  Some difficulty with balance. Follow these instructions at home: For the time period you were told by your health care provider:  Rest as needed.  Do not participate in activities where you could fall or become injured.  Do not drive or use machinery.  Do not drink alcohol.  Do not take sleeping  pills or medicines that cause drowsiness.  Do not make important decisions or sign legal documents.  Do not take care of children on your own.      Eating and drinking  Follow the diet that is recommended by your health care provider.  Drink enough fluid to keep your urine pale yellow.  If you vomit: ? Drink water, juice, or soup when you can drink without vomiting. ? Make sure you have little or no nausea before eating solid foods. General instructions  Have a responsible adult stay with you for the time you are told. It is important to have someone help care for you until you are awake and alert.  Take over-the-counter and prescription medicines only as told by your health care provider.  If you have sleep apnea, surgery and certain medicines can increase your risk for breathing problems. Follow instructions from your health care provider about wearing your sleep device: ? Anytime you are sleeping, including during daytime naps. ? While taking prescription pain medicines, sleeping medicines, or medicines that make you drowsy.  Avoid smoking.  Keep all follow-up visits as told by your health care provider. This is important. Contact a health care provider if:  You keep feeling nauseous or you keep vomiting.  You feel light-headed.  You are still sleepy or having trouble with balance after 24 hours.  You develop a rash.  You have a fever.  You have redness or swelling around the IV site. Get help right away if:  You have trouble breathing.  You have new-onset confusion at home. Summary  For several hours after your procedure, you may feel tired. You may also be forgetful and have poor judgment.  Have a responsible adult stay with you for the time you are told. It is important to have someone help care for you until you are awake and alert.  Rest as told. Do not drive or operate machinery. Do not drink alcohol or take sleeping pills.  Get help right away if you  have trouble breathing, or if you suddenly become confused. This information is not intended to replace advice given to you by your health care provider. Make sure you discuss any questions you have with your health care provider. Document Revised: 03/29/2020 Document Reviewed: 06/16/2019 Elsevier Patient Education  2021 [employer].

## 2020-09-19 ENCOUNTER — Encounter (HOSPITAL_COMMUNITY)
Admission: RE | Admit: 2020-09-19 | Discharge: 2020-09-19 | Disposition: A | Discharge status: Home / Self Care | Payer: Medicare Other | Source: Ambulatory Visit | Attending: Gastroenterology | Admitting: Gastroenterology

## 2020-09-19 ENCOUNTER — Encounter (HOSPITAL_COMMUNITY): Payer: Self-pay

## 2020-09-19 ENCOUNTER — Other Ambulatory Visit: Payer: Self-pay

## 2020-09-19 ENCOUNTER — Other Ambulatory Visit (HOSPITAL_COMMUNITY)
Admission: RE | Admit: 2020-09-19 | Discharge: 2020-09-19 | Disposition: A | Discharge status: Home / Self Care | Payer: Medicare Other | Source: Ambulatory Visit | Attending: Gastroenterology | Admitting: Gastroenterology

## 2020-09-19 DIAGNOSIS — Z01818 Encounter for other preprocedural examination: Secondary | ICD-10-CM | POA: Diagnosis not present

## 2020-09-19 DIAGNOSIS — Z20822 Contact with and (suspected) exposure to covid-19: Secondary | ICD-10-CM | POA: Diagnosis not present

## 2020-09-19 LAB — CBC WITH DIFFERENTIAL/PLATELET
Abs Immature Granulocytes: 0.02 10*3/uL (ref 0.00–0.07)
Basophils Absolute: 0 10*3/uL (ref 0.0–0.1)
Basophils Relative: 0 %
Eosinophils Absolute: 0.1 10*3/uL (ref 0.0–0.5)
Eosinophils Relative: 2 %
HCT: 46.5 % (ref 39.0–52.0)
Hemoglobin: 15.4 g/dL (ref 13.0–17.0)
Immature Granulocytes: 0 %
Lymphocytes Relative: 36 %
Lymphs Abs: 2.4 10*3/uL (ref 0.7–4.0)
MCH: 31.4 pg (ref 26.0–34.0)
MCHC: 33.1 g/dL (ref 30.0–36.0)
MCV: 94.9 fL (ref 80.0–100.0)
Monocytes Absolute: 0.7 10*3/uL (ref 0.1–1.0)
Monocytes Relative: 11 %
Neutro Abs: 3.4 10*3/uL (ref 1.7–7.7)
Neutrophils Relative %: 51 %
Platelets: 205 10*3/uL (ref 150–400)
RBC: 4.9 MIL/uL (ref 4.22–5.81)
RDW: 13.9 % (ref 11.5–15.5)
WBC: 6.7 10*3/uL (ref 4.0–10.5)
nRBC: 0 % (ref 0.0–0.2)

## 2020-09-19 LAB — SARS CORONAVIRUS 2 (TAT 6-24 HRS): SARS Coronavirus 2: NEGATIVE

## 2020-09-21 ENCOUNTER — Ambulatory Visit (HOSPITAL_COMMUNITY)
Admission: RE | Admit: 2020-09-21 | Discharge: 2020-09-21 | Disposition: A | Discharge status: Home / Self Care | Payer: Medicare Other | Source: Ambulatory Visit | Attending: Gastroenterology | Admitting: Gastroenterology

## 2020-09-21 ENCOUNTER — Ambulatory Visit (HOSPITAL_COMMUNITY): Payer: Medicare Other | Admitting: Anesthesiology

## 2020-09-21 ENCOUNTER — Encounter (HOSPITAL_COMMUNITY): Payer: Self-pay | Admitting: Gastroenterology

## 2020-09-21 ENCOUNTER — Encounter (HOSPITAL_COMMUNITY)
Admission: RE | Disposition: A | Discharge status: Home / Self Care | Payer: Self-pay | Source: Ambulatory Visit | Attending: Gastroenterology

## 2020-09-21 DIAGNOSIS — Z7982 Long term (current) use of aspirin: Secondary | ICD-10-CM | POA: Insufficient documentation

## 2020-09-21 DIAGNOSIS — I509 Heart failure, unspecified: Secondary | ICD-10-CM | POA: Diagnosis not present

## 2020-09-21 DIAGNOSIS — I130 Hypertensive heart and chronic kidney disease with heart failure and stage 1 through stage 4 chronic kidney disease, or unspecified chronic kidney disease: Secondary | ICD-10-CM | POA: Insufficient documentation

## 2020-09-21 DIAGNOSIS — Z79899 Other long term (current) drug therapy: Secondary | ICD-10-CM | POA: Diagnosis not present

## 2020-09-21 DIAGNOSIS — D124 Benign neoplasm of descending colon: Secondary | ICD-10-CM | POA: Insufficient documentation

## 2020-09-21 DIAGNOSIS — K635 Polyp of colon: Secondary | ICD-10-CM | POA: Diagnosis not present

## 2020-09-21 DIAGNOSIS — I2510 Atherosclerotic heart disease of native coronary artery without angina pectoris: Secondary | ICD-10-CM | POA: Insufficient documentation

## 2020-09-21 DIAGNOSIS — Z7902 Long term (current) use of antithrombotics/antiplatelets: Secondary | ICD-10-CM | POA: Diagnosis not present

## 2020-09-21 DIAGNOSIS — K5730 Diverticulosis of large intestine without perforation or abscess without bleeding: Secondary | ICD-10-CM | POA: Insufficient documentation

## 2020-09-21 DIAGNOSIS — I429 Cardiomyopathy, unspecified: Secondary | ICD-10-CM | POA: Insufficient documentation

## 2020-09-21 DIAGNOSIS — K648 Other hemorrhoids: Secondary | ICD-10-CM | POA: Diagnosis not present

## 2020-09-21 DIAGNOSIS — Z8249 Family history of ischemic heart disease and other diseases of the circulatory system: Secondary | ICD-10-CM | POA: Diagnosis not present

## 2020-09-21 DIAGNOSIS — K3189 Other diseases of stomach and duodenum: Secondary | ICD-10-CM | POA: Diagnosis not present

## 2020-09-21 DIAGNOSIS — D509 Iron deficiency anemia, unspecified: Secondary | ICD-10-CM | POA: Diagnosis not present

## 2020-09-21 DIAGNOSIS — N400 Enlarged prostate without lower urinary tract symptoms: Secondary | ICD-10-CM | POA: Diagnosis not present

## 2020-09-21 DIAGNOSIS — D500 Iron deficiency anemia secondary to blood loss (chronic): Secondary | ICD-10-CM | POA: Diagnosis not present

## 2020-09-21 DIAGNOSIS — I4891 Unspecified atrial fibrillation: Secondary | ICD-10-CM | POA: Diagnosis not present

## 2020-09-21 DIAGNOSIS — K2970 Gastritis, unspecified, without bleeding: Secondary | ICD-10-CM

## 2020-09-21 DIAGNOSIS — K269 Duodenal ulcer, unspecified as acute or chronic, without hemorrhage or perforation: Secondary | ICD-10-CM | POA: Insufficient documentation

## 2020-09-21 DIAGNOSIS — I252 Old myocardial infarction: Secondary | ICD-10-CM | POA: Diagnosis not present

## 2020-09-21 DIAGNOSIS — N183 Chronic kidney disease, stage 3 unspecified: Secondary | ICD-10-CM | POA: Diagnosis not present

## 2020-09-21 DIAGNOSIS — Z951 Presence of aortocoronary bypass graft: Secondary | ICD-10-CM | POA: Insufficient documentation

## 2020-09-21 HISTORY — PX: POLYPECTOMY: SHX149

## 2020-09-21 HISTORY — PX: BIOPSY: SHX5522

## 2020-09-21 HISTORY — PX: COLONOSCOPY WITH PROPOFOL: SHX5780

## 2020-09-21 HISTORY — PX: ESOPHAGOGASTRODUODENOSCOPY (EGD) WITH PROPOFOL: SHX5813

## 2020-09-21 LAB — HM COLONOSCOPY

## 2020-09-21 SURGERY — COLONOSCOPY WITH PROPOFOL
Anesthesia: General

## 2020-09-21 MED ORDER — EPHEDRINE SULFATE-NACL 50-0.9 MG/10ML-% IV SOSY
PREFILLED_SYRINGE | INTRAVENOUS | Status: DC | PRN
  Administered 2020-09-21 (×3): 5 mg via INTRAVENOUS

## 2020-09-21 MED ORDER — LIDOCAINE HCL (PF) 2 % IJ SOLN
INTRAMUSCULAR | Status: AC
  Filled 2020-09-21: qty 20

## 2020-09-21 MED ORDER — LACTATED RINGERS IV SOLN
INTRAVENOUS | Status: DC
  Administered 2020-09-21: 1000 mL via INTRAVENOUS

## 2020-09-21 MED ORDER — PHENYLEPHRINE 40 MCG/ML (10ML) SYRINGE FOR IV PUSH (FOR BLOOD PRESSURE SUPPORT)
PREFILLED_SYRINGE | INTRAVENOUS | Status: AC
  Filled 2020-09-21: qty 10

## 2020-09-21 MED ORDER — PHENYLEPHRINE 40 MCG/ML (10ML) SYRINGE FOR IV PUSH (FOR BLOOD PRESSURE SUPPORT)
PREFILLED_SYRINGE | INTRAVENOUS | Status: DC | PRN
  Administered 2020-09-21 (×5): 80 ug via INTRAVENOUS

## 2020-09-21 MED ORDER — EPHEDRINE 5 MG/ML INJ
INTRAVENOUS | Status: AC
  Filled 2020-09-21: qty 10

## 2020-09-21 MED ORDER — PROPOFOL 500 MG/50ML IV EMUL
INTRAVENOUS | Status: DC | PRN
  Administered 2020-09-21: 150 ug/kg/min via INTRAVENOUS

## 2020-09-21 MED ORDER — PROPOFOL 10 MG/ML IV BOLUS
INTRAVENOUS | Status: DC | PRN
  Administered 2020-09-21: 40 mg via INTRAVENOUS
  Administered 2020-09-21: 100 mg via INTRAVENOUS

## 2020-09-21 MED ORDER — LIDOCAINE HCL (CARDIAC) PF 100 MG/5ML IV SOSY
PREFILLED_SYRINGE | INTRAVENOUS | Status: DC | PRN
  Administered 2020-09-21: 50 mg via INTRAVENOUS

## 2020-09-21 NOTE — Anesthesia Postprocedure Evaluation (Signed)
Anesthesia Post Note  Patient: Craig Forbes  Procedure(s) Performed: COLONOSCOPY WITH PROPOFOL (N/A ) ESOPHAGOGASTRODUODENOSCOPY (EGD) WITH PROPOFOL (N/A ) BIOPSY POLYPECTOMY INTESTINAL  Patient location during evaluation: Phase II Anesthesia Type: General Level of consciousness: awake and alert and oriented Pain management: pain level controlled Vital Signs Assessment: post-procedure vital signs reviewed and stable Respiratory status: spontaneous breathing, nonlabored ventilation and respiratory function stable Cardiovascular status: blood pressure returned to baseline and stable Postop Assessment: no apparent nausea or vomiting Anesthetic complications: no   No complications documented.   Last Vitals:  Vitals:   09/21/20 0817 09/21/20 0954  BP: 108/81 107/61  Pulse: 63 60  Resp: 18 15  Temp: 36.6 C 36.4 C  SpO2: 97% 98%    Last Pain:  Vitals:   09/21/20 0954  TempSrc: Oral  PainSc: 0-No pain                 Orlie Dakin

## 2020-09-21 NOTE — Interval H&P Note (Signed)
History and Physical Interval Note:  09/21/2020 8:58 AM  Craig Forbes is a 74 y.o. male with PMH CKD 3, NSTEMI s/p CABG in 03/2020, melanoma s/p resection, asthma, BPH, anxiety, heart failure with most recent ejection fraction 45%, hypertension, who comes to the hospital for evaluation of iron deficiency.  The patient had evidence of iron deficiency in the past as he had low saturation iron of 16% but normal iron stores of 64.  His ferritin was 48.  Nevertheless, his hemoglobin has been stable and 14.6.  He denies having any melena, hematochezia, nausea, vomiting, fever, chills or any abdominal complaints.  He had a repeat CBC done before this endoscopy which showed a hemoglobin above 15.  The patient has never had an EGD in the past had a colonoscopy in 2013 which showed a small polyp.  BP 108/81   Pulse 63   Temp 97.8 F (36.6 C) (Oral)   Resp 18   SpO2 97%  GENERAL: The patient is AO x3, in no acute distress. HEENT: Head is normocephalic and atraumatic. EOMI are intact. Mouth is well hydrated and without lesions. NECK: Supple. No masses LUNGS: Clear to auscultation. No presence of rhonchi/wheezing/rales. Adequate chest expansion HEART: RRR, normal s1 and s2. ABDOMEN: Soft, nontender, no guarding, no peritoneal signs, and nondistended. BS +. No masses. EXTREMITIES: Without any cyanosis, clubbing, rash, lesions or edema. NEUROLOGIC: AOx3, no focal motor deficit. SKIN: no jaundice, no rashes   Craig Forbes  has presented today for surgery, with the diagnosis of Iron deficiency anemia.  The various methods of treatment have been discussed with the patient and family. After consideration of risks, benefits and other options for treatment, the patient has consented to  Procedure(s) with comments: COLONOSCOPY WITH PROPOFOL (N/A) - 9:15 ESOPHAGOGASTRODUODENOSCOPY (EGD) WITH PROPOFOL (N/A) as a surgical intervention.  The patient's history has been reviewed, patient examined, no change in  status, stable for surgery.  I have reviewed the patient's chart and labs.  Questions were answered to the patient's satisfaction.     [name]

## 2020-09-21 NOTE — Discharge Instructions (Signed)
You are being discharged to home.  Resume your previous diet.  We are waiting for your pathology results.  No need for repeat colonoscopy given age. Restart Plavix today.  PATIENT INSTRUCTIONS POST-ANESTHESIA  IMMEDIATELY FOLLOWING SURGERY:  Do not drive or operate machinery for the first twenty four hours after surgery.  Do not make any important decisions for twenty four hours after surgery or while taking narcotic pain medications or sedatives.  If you develop intractable nausea and vomiting or a severe headache please notify your doctor immediately.  FOLLOW-UP:  Please make an appointment with your surgeon as instructed. You do not need to follow up with anesthesia unless specifically instructed to do so.  WOUND CARE INSTRUCTIONS (if applicable):  Keep a dry clean dressing on the anesthesia/puncture wound site if there is drainage.  Once the wound has quit draining you may leave it open to air.  Generally you should leave the bandage intact for twenty four hours unless there is drainage.  If the epidural site drains for more than 36-48 hours please call the anesthesia department.  QUESTIONS?:  Please feel free to call your physician or the hospital operator if you have any questions, and they will be happy to assist you.      Upper Endoscopy, Adult, Care After This sheet gives you information about how to care for yourself after your procedure. Your health care provider may also give you more specific instructions. If you have problems or questions, contact your health care provider. What can I expect after the procedure? After the procedure, it is common to have:  A sore throat.  Mild stomach pain or discomfort.  Bloating.  Nausea. Follow these instructions at home:  Follow instructions from your health care provider about what to eat or drink after your procedure.  Return to your normal activities as told by your health care provider. Ask your health care provider what  activities are safe for you.  Take over-the-counter and prescription medicines only as told by your health care provider.  If you were given a sedative during the procedure, it can affect you for several hours. Do not drive or operate machinery until your health care provider says that it is safe.  Keep all follow-up visits as told by your health care provider. This is important.   Contact a health care provider if you have:  A sore throat that lasts longer than one day.  Trouble swallowing. Get help right away if:  You vomit blood or your vomit looks like coffee grounds.  You have: ? A fever. ? Bloody, black, or tarry stools. ? A severe sore throat or you cannot swallow. ? Difficulty breathing. ? Severe pain in your chest or abdomen. Summary  After the procedure, it is common to have a sore throat, mild stomach discomfort, bloating, and nausea.  If you were given a sedative during the procedure, it can affect you for several hours. Do not drive or operate machinery until your health care provider says that it is safe.  Follow instructions from your health care provider about what to eat or drink after your procedure.  Return to your normal activities as told by your health care provider. This information is not intended to replace advice given to you by your health care provider. Make sure you discuss any questions you have with your health care provider. Document Revised: 07/12/2019 Document Reviewed: 12/14/2017 Elsevier Patient Education  2021 [place].   Gastritis, Adult  Gastritis is swelling (inflammation)  of the stomach. Gastritis can develop quickly (acute). It can also develop slowly over time (chronic). It is important to get help for this condition. If you do not get help, your stomach can bleed, and you can get sores (ulcers) in your stomach. What are the causes? This condition may be caused by:  Germs that get to your stomach.  Drinking too much  alcohol.  Medicines you are taking.  Too much acid in the stomach.  A disease of the intestines or stomach.  Stress.  An allergic reaction.  Crohn's disease.  Some cancer treatments (radiation). Sometimes the cause of this condition is not known. What are the signs or symptoms? Symptoms of this condition include:  Pain in your stomach.  A burning feeling in your stomach.  Feeling sick to your stomach (nauseous).  Throwing up (vomiting).  Feeling too full after you eat.  Weight loss.  Bad breath.  Throwing up blood.  Blood in your poop (stool). How is this diagnosed? This condition may be diagnosed with:  Your medical history and symptoms.  A physical exam.  Tests. These can include: ? Blood tests. ? Stool tests. ? A procedure to look inside your stomach (upper endoscopy). ? A test in which a sample of tissue is taken for testing (biopsy). How is this treated? Treatment for this condition depends on what caused it. You may be given:  Antibiotic medicine, if your condition was caused by germs.  H2 blockers and similar medicines, if your condition was caused by too much acid. Follow these instructions at home: Medicines  Take over-the-counter and prescription medicines only as told by your doctor.  If you were prescribed an antibiotic medicine, take it as told by your doctor. Do not stop taking it even if you start to feel better. Eating and drinking  Eat small meals often, instead of large meals.  Avoid foods and drinks that make your symptoms worse.  Drink enough fluid to keep your pee (urine) pale yellow.   Alcohol use  Do not drink alcohol if: ? Your doctor tells you not to drink. ? You are pregnant, may be pregnant, or are planning to become pregnant.  If you drink alcohol: ? Limit your use to:  0-1 drink a day for women.  0-2 drinks a day for men. ? Be aware of how much alcohol is in your drink. In the [place] one drink equals one 12 oz  bottle of beer (355 mL), one 5 oz glass of wine (148 mL), or one 1 oz glass of hard liquor (44 mL). General instructions  Talk with your doctor about ways to manage stress. You can exercise or do deep breathing, meditation, or yoga.  Do not smoke or use products that have nicotine or tobacco. If you need help quitting, ask your doctor.  Keep all follow-up visits as told by your doctor. This is important. Contact a doctor if:  Your symptoms get worse.  Your symptoms go away and then come back. Get help right away if:  You throw up blood or something that looks like coffee grounds.  You have black or dark red poop.  You throw up any time you try to drink fluids.  Your stomach pain gets worse.  You have a fever.  You do not feel better after one week. Summary  Gastritis is swelling (inflammation) of the stomach.  You must get help for this condition. If you do not get help, your stomach can bleed, and you can  get sores (ulcers).  This condition is diagnosed with medical history, physical exam, or tests.  You can be treated with medicines for germs or medicines to block too much acid in your stomach. This information is not intended to replace advice given to you by your health care provider. Make sure you discuss any questions you have with your health care provider. Document Revised: 12/01/2017 Document Reviewed: 12/01/2017 Elsevier Patient Education  2021 [place].   Colonoscopy, Adult, Care After This sheet gives you information about how to care for yourself after your procedure. Your doctor may also give you more specific instructions. If you have problems or questions, call your doctor. What can I expect after the procedure? After the procedure, it is common to have:  A small amount of blood in your poop (stool) for 24 hours.  Some gas.  Mild cramping or bloating in your belly (abdomen). Follow these instructions at home: Eating and drinking  Drink enough  fluid to keep your pee (urine) pale yellow.  Follow instructions from your doctor about what you cannot eat or drink.  Return to your normal diet as told by your doctor. Avoid heavy or fried foods that are hard to digest.   Activity  Rest as told by your doctor.  Do not sit for a long time without moving. Get up to take short walks every 1-2 hours. This is important. Ask for help if you feel weak or unsteady.  Return to your normal activities as told by your doctor. Ask your doctor what activities are safe for you. To help cramping and bloating:  Try walking around.  Put heat on your belly as told by your doctor. Use the heat source that your doctor recommends, such as a moist heat pack or a heating pad. ? Put a towel between your skin and the heat source. ? Leave the heat on for 20-30 minutes. ? Remove the heat if your skin turns bright red. This is very important if you are unable to feel pain, heat, or cold. You may have a greater risk of getting burned.   General instructions  If you were given a medicine to help you relax (sedative) during your procedure, it can affect you for many hours. Do not drive or use machinery until your doctor says that it is safe.  For the first 24 hours after the procedure: ? Do not sign important documents. ? Do not drink alcohol. ? Do your daily activities more slowly than normal. ? Eat foods that are soft and easy to digest.  Take over-the-counter or prescription medicines only as told by your doctor.  Keep all follow-up visits as told by your doctor. This is important. Contact a doctor if:  You have blood in your poop 2-3 days after the procedure. Get help right away if:  You have more than a small amount of blood in your poop.  You see large clumps of tissue (blood clots) in your poop.  Your belly is swollen.  You feel like you may vomit (nauseous).  You vomit.  You have a fever.  You have belly pain that gets worse, and medicine  does not help your pain. Summary  After the procedure, it is common to have a small amount of blood in your poop. You may also have mild cramping and bloating in your belly.  If you were given a medicine to help you relax (sedative) during your procedure, it can affect you for many hours. Do not drive  or use machinery until your doctor says that it is safe.  Get help right away if you have a lot of blood in your poop, feel like you may vomit, have a fever, or have more belly pain. This information is not intended to replace advice given to you by your health care provider. Make sure you discuss any questions you have with your health care provider. Document Revised: 05/20/2019 Document Reviewed: 02/07/2019 Elsevier Patient Education  [place].    Colon Polyps  Colon polyps are tissue growths inside the colon, which is part of the large intestine. They are one of the types of polyps that can grow in the body. A polyp may be a round bump or a mushroom-shaped growth. You could have one polyp or more than one. Most colon polyps are noncancerous (benign). However, some colon polyps can become cancerous over time. Finding and removing the polyps early can help prevent this. What are the causes? The exact cause of colon polyps is not known. What increases the risk? The following factors may make you more likely to develop this condition:  Having a family history of colorectal cancer or colon polyps.  Being older than 76 years of age.  Being younger than 76 years of age and having a significant family history of colorectal cancer or colon polyps or a genetic condition that puts you at higher risk of getting colon polyps.  Having inflammatory bowel disease, such as ulcerative colitis or Crohn's disease.  Having certain conditions passed from parent to child (hereditary conditions), such as: ? Familial adenomatous polyposis (FAP). ? Lynch syndrome. ? Turcot  syndrome. ? Peutz-Jeghers syndrome. ? MUTYH-associated polyposis (MAP).  Being overweight.  Certain lifestyle factors. These include smoking cigarettes, drinking too much alcohol, not getting enough exercise, and eating a diet that is high in fat and red meat and low in fiber.  Having had childhood cancer that was treated with radiation of the abdomen. What are the signs or symptoms? Many times, there are no symptoms. If you have symptoms, they may include:  Blood coming from the rectum during a bowel movement.  Blood in the stool (feces). The blood may be bright red or very dark in color.  Pain in the abdomen.  A change in bowel habits, such as constipation or diarrhea. How is this diagnosed? This condition is diagnosed with a colonoscopy. This is a procedure in which a lighted, flexible scope is inserted into the opening between the buttocks (anus) and then passed into the colon to examine the area. Polyps are sometimes found when a colonoscopy is done as part of routine cancer screening tests. How is this treated? This condition is treated by removing any polyps that are found. Most polyps can be removed during a colonoscopy. Those polyps will then be tested for cancer. Additional treatment may be needed depending on the results of testing. Follow these instructions at home: Eating and drinking  Eat foods that are high in fiber, such as fruits, vegetables, and whole grains.  Eat foods that are high in calcium and vitamin D, such as milk, cheese, yogurt, eggs, liver, fish, and broccoli.  Limit foods that are high in fat, such as fried foods and desserts.  Limit the amount of red meat, precooked or cured meat, or other processed meat that you eat, such as hot dogs, sausages, bacon, or meat loaves.  Limit sugary drinks.   Lifestyle  Maintain a healthy weight, or lose weight if recommended by your health  care provider.  Exercise every day or as told by your health care  provider.  Do not use any products that contain nicotine or tobacco, such as cigarettes, e-cigarettes, and chewing tobacco. If you need help quitting, ask your health care provider.  Do not drink alcohol if: ? Your health care provider tells you not to drink. ? You are pregnant, may be pregnant, or are planning to become pregnant.  If you drink alcohol: ? Limit how much you use to:  0-1 drink a day for women.  0-2 drinks a day for men. ? Know how much alcohol is in your drink. In the [place] one drink equals one 12 oz bottle of beer (355 mL), one 5 oz glass of wine (148 mL), or one 1 oz glass of hard liquor (44 mL). General instructions  Take over-the-counter and prescription medicines only as told by your health care provider.  Keep all follow-up visits. This is important. This includes having regularly scheduled colonoscopies. Talk to your health care provider about when you need a colonoscopy. Contact a health care provider if:  You have new or worsening bleeding during a bowel movement.  You have new or increased blood in your stool.  You have a change in bowel habits.  You lose weight for no known reason. Summary  Colon polyps are tissue growths inside the colon, which is part of the large intestine. They are one type of polyp that can grow in the body.  Most colon polyps are noncancerous (benign), but some can become cancerous over time.  This condition is diagnosed with a colonoscopy.  This condition is treated by removing any polyps that are found. Most polyps can be removed during a colonoscopy. This information is not intended to replace advice given to you by your health care provider. Make sure you discuss any questions you have with your health care provider. Document Revised: 11/02/2019 Document Reviewed: 11/02/2019 Elsevier Patient Education  2021 [place].    Hemorrhoids Hemorrhoids are swollen veins that may develop:  In the butt (rectum). These are  called internal hemorrhoids.  Around the opening of the butt (anus). These are called external hemorrhoids. Hemorrhoids can cause pain, itching, or bleeding. Most of the time, they do not cause serious problems. They usually get better with diet changes, lifestyle changes, and other home treatments. What are the causes? This condition may be caused by:  Having trouble pooping (constipation).  Pushing hard (straining) to poop.  Watery poop (diarrhea).  Pregnancy.  Being very overweight (obese).  Sitting for long periods of time.  Heavy lifting or other activity that causes you to strain.  Anal sex.  Riding a bike for a long period of time. What are the signs or symptoms? Symptoms of this condition include:  Pain.  Itching or soreness in the butt.  Bleeding from the butt.  Leaking poop.  Swelling in the area.  One or more lumps around the opening of your butt. How is this diagnosed? A doctor can often diagnose this condition by looking at the affected area. The doctor may also:  Do an exam that involves feeling the area with a gloved hand (digital rectal exam).  Examine the area inside your butt using a small tube (anoscope).  Order blood tests. This may be done if you have lost a lot of blood.  Have you get a test that involves looking inside the colon using a flexible tube with a camera on the end (sigmoidoscopy or colonoscopy). How  is this treated? This condition can usually be treated at home. Your doctor may tell you to change what you eat, make lifestyle changes, or try home treatments. If these do not help, procedures can be done to remove the hemorrhoids or make them smaller. These may involve:  Placing rubber bands at the base of the hemorrhoids to cut off their blood supply.  Injecting medicine into the hemorrhoids to shrink them.  Shining a type of light energy onto the hemorrhoids to cause them to fall off.  Doing surgery to remove the hemorrhoids or  cut off their blood supply. Follow these instructions at home: Eating and drinking  Eat foods that have a lot of fiber in them. These include whole grains, beans, nuts, fruits, and vegetables.  Ask your doctor about taking products that have added fiber (fibersupplements).  Reduce the amount of fat in your diet. You can do this by: ? Eating low-fat dairy products. ? Eating less red meat. ? Avoiding processed foods.  Drink enough fluid to keep your pee (urine) pale yellow.   Managing pain and swelling  Take a warm-water bath (sitz bath) for 20 minutes to ease pain. Do this 3-4 times a day. You may do this in a bathtub or using a portable sitz bath that fits over the toilet.  If told, put ice on the painful area. It may be helpful to use ice between your warm baths. ? Put ice in a plastic bag. ? Place a towel between your skin and the bag. ? Leave the ice on for 20 minutes, 2-3 times a day.   General instructions  Take over-the-counter and prescription medicines only as told by your doctor. ? Medicated creams and medicines may be used as told.  Exercise often. Ask your doctor how much and what kind of exercise is best for you.  Go to the bathroom when you have the urge to poop. Do not wait.  Avoid pushing too hard when you poop.  Keep your butt dry and clean. Use wet toilet paper or moist towelettes after pooping.  Do not sit on the toilet for a long time.  Keep all follow-up visits as told by your doctor. This is important. Contact a doctor if you:  Have pain and swelling that do not get better with treatment or medicine.  Have trouble pooping.  Cannot poop.  Have pain or swelling outside the area of the hemorrhoids. Get help right away if you have:  Bleeding that will not stop. Summary  Hemorrhoids are swollen veins in the butt or around the opening of the butt.  They can cause pain, itching, or bleeding.  Eat foods that have a lot of fiber in them. These  include whole grains, beans, nuts, fruits, and vegetables.  Take a warm-water bath (sitz bath) for 20 minutes to ease pain. Do this 3-4 times a day. This information is not intended to replace advice given to you by your health care provider. Make sure you discuss any questions you have with your health care provider. Document Revised: 07/22/2018 Document Reviewed: 12/03/2017 Elsevier Patient Education  [place].

## 2020-09-21 NOTE — Op Note (Signed)
[facility] Patient Name: Craig Forbes Procedure Date: 09/21/2020 8:52 AM MRN: [id number] Date of Birth: Sep 17, 1945 Attending MD: [name] ,  CSN: [id number] Age: 75 Admit Type: Outpatient Procedure:                Upper GI endoscopy Indications:              Iron deficiency Providers:                [name], Jeanann Lewandowsky. Sharon Seller, RN, Nelma Rothman, Technician Referring MD:              Medicines:                Monitored Anesthesia Care Complications:            No immediate complications. Estimated Blood Loss:     Estimated blood loss: none. Procedure:                Pre-Anesthesia Assessment:                           - Prior to the procedure, a History and Physical                            was performed, and patient medications, allergies                            and sensitivities were reviewed. The patient's                            tolerance of previous anesthesia was reviewed.                           - The risks and benefits of the procedure and the                            sedation options and risks were discussed with the                            patient. All questions were answered and informed                            consent was obtained.                           - ASA Grade Assessment: III - A patient with severe                            systemic disease.                           After obtaining informed consent, the endoscope was                            passed under direct vision. Throughout the  procedure, the patient's blood pressure, pulse, and                            oxygen saturations were monitored continuously. The                            [id number]-[id number] ([id number]) scope was introduced through the                            mouth, and advanced to the second part of duodenum.                            The upper GI endoscopy was accomplished without                             difficulty. The patient tolerated the procedure                            well. Scope In: 9:11:37 AM Scope Out: 9:18:55 AM Total Procedure Duration: 0 hours 7 minutes 18 seconds  Findings:      The examined esophagus was normal.      Localized mild inflammation characterized by erythema was found in the       gastric antrum. Biopsies were taken with a cold forceps for Helicobacter       pylori testing.      A few localized erosions without bleeding were found in the first       portion of the duodenum.      Diffuse mildly erythematous mucosa without active bleeding was found in       the duodenal bulb and in the first portion of the duodenum. Biopsies for       histology were taken from the normal duodenum with a cold forceps for       evaluation of celiac disease. Impression:               - Normal esophagus.                           - Gastritis. Biopsied.                           - Duodenal erosions without bleeding.                           - Erythematous duodenopathy. Normal duodenum                            biopsied. Moderate Sedation:      Per Anesthesia Care Recommendation:           - Discharge patient to home (ambulatory).                           - Resume previous diet.                           - Await pathology results. Procedure Code(s):        --- Professional ---  93241, Esophagogastroduodenoscopy, flexible,                            transoral; with biopsy, single or multiple Diagnosis Code(s):        --- Professional ---                           K29.70, Gastritis, unspecified, without bleeding                           K26.9, Duodenal ulcer, unspecified as acute or                            chronic, without hemorrhage or perforation                           K31.89, Other diseases of stomach and duodenum CPT copyright 2019 American Medical Association. All rights reserved. The codes documented in this report are preliminary and  upon coder review may  be revised to meet current compliance requirements. [name], MD [name],  09/21/2020 9:56:28 AM This report has been signed electronically. Number of Addenda: 0

## 2020-09-21 NOTE — Anesthesia Preprocedure Evaluation (Signed)
Anesthesia Evaluation  Patient identified by MRN, date of birth, ID band Patient awake    Reviewed: Allergy & Precautions, H&P , NPO status , Patient's Chart, lab work & pertinent test results, reviewed documented beta blocker date and time   Airway Mallampati: II  TM Distance: >3 FB Neck ROM: full    Dental no notable dental hx.    Pulmonary asthma ,    Pulmonary exam normal breath sounds clear to auscultation       Cardiovascular Exercise Tolerance: Good hypertension, + CAD, + Past MI and + CABG  Atrial Fibrillation  Rhythm:regular Rate:Normal     Neuro/Psych PSYCHIATRIC DISORDERS Anxiety negative neurological ROS     GI/Hepatic negative GI ROS, Neg liver ROS,   Endo/Other  negative endocrine ROS  Renal/GU CRFRenal disease  negative genitourinary   Musculoskeletal   Abdominal   Peds  Hematology negative hematology ROS (+)   Anesthesia Other Findings   Reproductive/Obstetrics negative OB ROS                             Anesthesia Physical Anesthesia Plan  ASA: III  Anesthesia Plan: General   Post-op Pain Management:    Induction:   PONV Risk Score and Plan: Propofol infusion  Airway Management Planned:   Additional Equipment:   Intra-op Plan:   Post-operative Plan:   Informed Consent: I have reviewed the patients History and Physical, chart, labs and discussed the procedure including the risks, benefits and alternatives for the proposed anesthesia with the patient or authorized representative who has indicated his/her understanding and acceptance.     Dental Advisory Given  Plan Discussed with: CRNA  Anesthesia Plan Comments:         Anesthesia Quick Evaluation

## 2020-09-21 NOTE — Op Note (Signed)
[facility] Patient Name: Craig Forbes Procedure Date: 09/21/2020 9:20 AM MRN: [id number] Date of Birth: 1946/01/28 Attending MD: [name] ,  CSN: [id number] Age: 75 Admit Type: Outpatient Procedure:                Colonoscopy Indications:              Iron deficiency Providers:                [name], [place] Sharon Seller, RN, Nelma Rothman, Technician Referring MD:              Medicines:                Monitored Anesthesia Care Complications:            No immediate complications. Estimated Blood Loss:     Estimated blood loss: none. Procedure:                Pre-Anesthesia Assessment:                           - Prior to the procedure, a History and Physical                            was performed, and patient medications, allergies                            and sensitivities were reviewed. The patient's                            tolerance of previous anesthesia was reviewed.                           - The risks and benefits of the procedure and the                            sedation options and risks were discussed with the                            patient. All questions were answered and informed                            consent was obtained.                           - ASA Grade Assessment: III - A patient with severe                            systemic disease.                           After obtaining informed consent, the colonoscope                            was passed under direct vision. Throughout the  procedure, the patient's blood pressure, pulse, and                            oxygen saturations were monitored continuously. The                            [id number]-[id number] ([id number]) was introduced through the                            anus and advanced to the the terminal ileum. The                            colonoscopy was performed without difficulty. The                            patient  tolerated the procedure well. The quality                            of the bowel preparation was excellent. Scope                            withdrawal time was 16 minutes. Scope In: 9:23:48 AM Scope Out: 9:51:10 AM Scope Withdrawal Time: 0 hours 20 minutes 57 seconds  Total Procedure Duration: 0 hours 27 minutes 22 seconds  Findings:      The perianal and digital rectal examinations were normal.      The terminal ileum appeared normal.      Two sessile polyps were found in the descending colon. The polyps were 1       to 2 mm in size. These polyps were removed with a cold biopsy forceps.       Resection and retrieval were complete.      A few large-mouthed diverticula were found in the sigmoid colon.      Non-bleeding internal hemorrhoids were found during retroflexion. The       hemorrhoids were small. Impression:               - The examined portion of the ileum was normal.                           - Two 1 to 2 mm polyps in the descending colon,                            removed with a cold biopsy forceps. Resected and                            retrieved.                           - Diverticulosis in the sigmoid colon.                           - Non-bleeding internal hemorrhoids. Moderate Sedation:      Per Anesthesia Care Recommendation:           - Discharge patient to home (ambulatory).                           -  Resume previous diet.                           - Await pathology results.                           - Repeat colonoscopy is not recommended due to                            current age (64 years or older) for screening                            purposes. Procedure Code(s):        --- Professional ---                           [phone], Colonoscopy, flexible; with biopsy, single                            or multiple Diagnosis Code(s):        --- Professional ---                           K63.5, Polyp of colon                           K64.8, Other hemorrhoids                            K57.30, Diverticulosis of large intestine without                            perforation or abscess without bleeding CPT copyright 2019 American Medical Association. All rights reserved. The codes documented in this report are preliminary and upon coder review may  be revised to meet current compliance requirements. [name], MD [name],  09/21/2020 9:59:06 AM This report has been signed electronically. Number of Addenda: 0

## 2020-09-21 NOTE — Transfer of Care (Signed)
Immediate Anesthesia Transfer of Care Note  Patient: Craig Forbes  Procedure(s) Performed: COLONOSCOPY WITH PROPOFOL (N/A ) ESOPHAGOGASTRODUODENOSCOPY (EGD) WITH PROPOFOL (N/A ) BIOPSY POLYPECTOMY INTESTINAL  Patient Location: Short Stay  Anesthesia Type:General  Level of Consciousness: awake, alert  and oriented  Airway & Oxygen Therapy: Patient Spontanous Breathing  Post-op Assessment: Report given to RN, Post -op Vital signs reviewed and stable and Patient moving all extremities X 4  Post vital signs: Reviewed and stable  Last Vitals:  Vitals Value Taken Time  BP    Temp    Pulse    Resp    SpO2      Last Pain:  Vitals:   09/21/20 0903  TempSrc:   PainSc: 0-No pain      Patients Stated Pain Goal: 8 (09/40/76 8088)  Complications: No complications documented.

## 2020-09-21 NOTE — Anesthesia Procedure Notes (Signed)
Date/Time: 09/21/2020 9:09 AM Performed by: Orlie Dakin, CRNA Pre-anesthesia Checklist: Patient identified, Emergency Drugs available, Suction available and Patient being monitored Patient Re-evaluated:Patient Re-evaluated prior to induction Oxygen Delivery Method: Nasal cannula Induction Type: IV induction Placement Confirmation: positive ETCO2

## 2020-09-24 DIAGNOSIS — I10 Essential (primary) hypertension: Secondary | ICD-10-CM | POA: Diagnosis not present

## 2020-09-24 DIAGNOSIS — Z789 Other specified health status: Secondary | ICD-10-CM | POA: Diagnosis not present

## 2020-09-24 DIAGNOSIS — Z6824 Body mass index (BMI) 24.0-24.9, adult: Secondary | ICD-10-CM | POA: Diagnosis not present

## 2020-09-24 DIAGNOSIS — K2970 Gastritis, unspecified, without bleeding: Secondary | ICD-10-CM | POA: Diagnosis not present

## 2020-09-24 DIAGNOSIS — I25119 Atherosclerotic heart disease of native coronary artery with unspecified angina pectoris: Secondary | ICD-10-CM | POA: Diagnosis not present

## 2020-09-24 DIAGNOSIS — Z299 Encounter for prophylactic measures, unspecified: Secondary | ICD-10-CM | POA: Diagnosis not present

## 2020-09-24 DIAGNOSIS — C439 Malignant melanoma of skin, unspecified: Secondary | ICD-10-CM | POA: Diagnosis not present

## 2020-09-24 LAB — SURGICAL PATHOLOGY

## 2020-09-25 ENCOUNTER — Encounter (HOSPITAL_COMMUNITY): Payer: Self-pay | Admitting: Gastroenterology

## 2020-09-25 ENCOUNTER — Encounter (INDEPENDENT_AMBULATORY_CARE_PROVIDER_SITE_OTHER): Payer: Self-pay | Admitting: *Deleted

## 2020-11-08 ENCOUNTER — Ambulatory Visit (INDEPENDENT_AMBULATORY_CARE_PROVIDER_SITE_OTHER): Payer: Medicare Other | Admitting: Gastroenterology

## 2020-11-12 ENCOUNTER — Encounter (INDEPENDENT_AMBULATORY_CARE_PROVIDER_SITE_OTHER): Payer: Self-pay | Admitting: Gastroenterology

## 2020-11-12 ENCOUNTER — Other Ambulatory Visit: Payer: Self-pay

## 2020-11-12 ENCOUNTER — Ambulatory Visit (INDEPENDENT_AMBULATORY_CARE_PROVIDER_SITE_OTHER): Payer: Medicare Other | Admitting: Gastroenterology

## 2020-11-12 VITALS — BP 143/89 | HR 66 | Temp 97.4°F | Ht 69.0 in | Wt 167.0 lb

## 2020-11-12 DIAGNOSIS — K2970 Gastritis, unspecified, without bleeding: Secondary | ICD-10-CM

## 2020-11-12 DIAGNOSIS — K2990 Gastroduodenitis, unspecified, without bleeding: Secondary | ICD-10-CM

## 2020-11-12 DIAGNOSIS — E611 Iron deficiency: Secondary | ICD-10-CM

## 2020-11-12 NOTE — Patient Instructions (Signed)
Continue oral iron supplementation Can take pantoprazole if low iron persists or if you develop new symptoms sucha as abdominal pain or heartburn

## 2020-11-12 NOTE — Progress Notes (Signed)
Craig Forbes, M.D. Gastroenterology & Hepatology [facility] [address] [place], [place] [address]  Primary Care Physician: Monico Blitz, [place] [address]  I will communicate my assessment and recommendations to the referring MD via EMR.  Problems: 1. Iron deficiency 2. Gastritis  History of Present Illness: Craig Forbes is a 75 y.o. male with PMH CKD 3, NSTEMI s/p CABG in 03/2020, melanoma s/p resection, asthma, BPH, anxiety, heart failure with most recent ejection fraction 45%, hypertension, who presents for follow-up although of iron deficiency.  The patient was last seen on 09/05/2020. At that time, the patient was scheduled to undergo EGD and colonoscopy.Both procedures were performed on 09/21/2020, esophagogastroduodenospy showed presence of erythema in the antrum, with negative biopsies for H. pylori.  There were also erosions in the first portion of the small bowel and diffuse erythema.  Biopsies were negative for celiac disease.  Colonoscopy had a normal terminal ileum, 2 diminutive polyps were removed from the descending colon (1 was a tubular adenoma), diverticulosis.  Patient reports that after seeing the report of the presence of gastritis, he asked his nephrologist about this finding who prescribed pantoprazole.  The patient has not started the medication as he has not felt any symptoms.  Patient denies any complaints at the moment.  Has been feeling well denies having any symptoms at all.  The patient denies having any nausea, vomiting, fever, chills, hematochezia, melena, hematemesis, abdominal distention, abdominal pain, diarrhea, jaundice, pruritus or weight loss.  Past Medical History: Past Medical History:  Diagnosis Date  . Anxiety   . Asthma   . BPH (benign prostatic hyperplasia)   . CAD (coronary artery disease)    Status post CABG with SVG to OM 2, SVG to ramus, and LIMA to LAD September  2021 [facility]  . Cardiomyopathy ([place])    LVEF 40-45% at time of CABG September 2021  . CKD (chronic kidney disease) stage 3, GFR 30-59 ml/min (HCC)   . Essential hypertension   . Melanoma ([place])   . NSTEMI (non-ST elevated myocardial infarction) [facility])    August 2021  . Postoperative atrial fibrillation [facility])     Past Surgical History: Past Surgical History:  Procedure Laterality Date  . BIOPSY  09/21/2020   Procedure: BIOPSY;  Surgeon: Harvel Quale, MD;  [facility];  Service: Gastroenterology;;  duodenal gastric  . COLONOSCOPY  03/11/2012   Procedure: COLONOSCOPY;  Surgeon: Rogene Houston, MD;  [facility];  Service: Endoscopy;  Laterality: N/A;  730  . COLONOSCOPY WITH PROPOFOL N/A 09/21/2020   Procedure: COLONOSCOPY WITH PROPOFOL;  Surgeon: Harvel Quale, MD;  [facility];  Service: Gastroenterology;  Laterality: N/A;  9:15  . CORONARY ARTERY BYPASS GRAFT  03/28/2020   [place]  . ESOPHAGOGASTRODUODENOSCOPY (EGD) WITH PROPOFOL N/A 09/21/2020   Procedure: ESOPHAGOGASTRODUODENOSCOPY (EGD) WITH PROPOFOL;  Surgeon: Harvel Quale, MD;  [facility];  Service: Gastroenterology;  Laterality: N/A;  . POLYPECTOMY  09/21/2020   Procedure: POLYPECTOMY INTESTINAL;  Surgeon: Harvel Quale, MD;  [facility];  Service: Gastroenterology;;    Family History: Family History  Problem Relation Age of Onset  . Asthma Mother   . Cancer Mother   . Diabetes Mellitus II Mother   . Heart disease Mother   . Hypertension Mother   . Arthritis Father   . Heart disease Father     Social History: Social History  Tobacco Use  Smoking Status Never Smoker  Smokeless Tobacco Never Used   Social History   Substance and Sexual Activity  Alcohol Use No   Social History   Substance and Sexual Activity  Drug Use No    Allergies: No Known Allergies  Medications: Current Outpatient Medications   Medication Sig Dispense Refill  . aspirin EC 81 MG tablet Take 81 mg by mouth daily.    [name] atorvastatin (LIPITOR) 80 MG tablet Take 80 mg by mouth daily.    . Cholecalciferol 25 MCG (1000 UT) CHEW Chew 1,000 Units by mouth daily.    . clopidogrel (PLAVIX) 75 MG tablet Take 75 mg by mouth daily.    . ferrous sulfate 325 (65 FE) MG tablet Take 325 mg by mouth 3 (three) times daily.    . finasteride (PROSCAR) 5 MG tablet Take 5 mg by mouth daily.    [name] losartan (COZAAR) 25 MG tablet Take 25 mg by mouth daily.    . metoprolol tartrate (LOPRESSOR) 25 MG tablet Take 25 mg by mouth 2 (two) times daily.    . SENNA PO Take 50 mg by mouth 2 (two) times daily.     No current facility-administered medications for this visit.    Review of Systems: GENERAL: negative for malaise, night sweats HEENT: No changes in hearing or vision, no nose bleeds or other nasal problems. NECK: Negative for lumps, goiter, pain and significant neck swelling RESPIRATORY: Negative for cough, wheezing CARDIOVASCULAR: Negative for chest pain, leg swelling, palpitations, orthopnea GI: SEE HPI MUSCULOSKELETAL: Negative for joint pain or swelling, back pain, and muscle pain. SKIN: Negative for lesions, rash PSYCH: Negative for sleep disturbance, mood disorder and recent psychosocial stressors. HEMATOLOGY Negative for prolonged bleeding, bruising easily, and swollen nodes. ENDOCRINE: Negative for cold or heat intolerance, polyuria, polydipsia and goiter. NEURO: negative for tremor, gait imbalance, syncope and seizures. The remainder of the review of systems is noncontributory.   Physical Exam: BP (!) 143/89 (BP Location: Left Arm, Patient Position: Sitting, Cuff Size: Small)   Pulse 66   Temp (!) 97.4 F (36.3 C) (Oral)   Ht 5\' 9"  (1.753 m)   Wt 167 lb (75.8 kg)   BMI 24.66 kg/m  GENERAL: The patient is AO x3, in no acute distress. HEENT: Head is normocephalic and atraumatic. EOMI are intact. Mouth is well hydrated  and without lesions. NECK: Supple. No masses LUNGS: Clear to auscultation. No presence of rhonchi/wheezing/rales. Adequate chest expansion HEART: RRR, normal s1 and s2. ABDOMEN: Soft, nontender, no guarding, no peritoneal signs, and nondistended. BS +. No masses. EXTREMITIES: Without any cyanosis, clubbing, rash, lesions or edema. NEUROLOGIC: AOx3, no focal motor deficit. SKIN: no jaundice, no rashes  Imaging/Labs: as above  I personally reviewed and interpreted the available labs, imaging and endoscopic files.  Impression and Plan: Craig Forbes is a 75 y.o. male with PMH CKD 3, NSTEMI s/p CABG in 03/2020, melanoma s/p resection, asthma, BPH, anxiety, heart failure with most recent ejection fraction 45%, hypertension, who presents for follow-up although of iron deficiency.  The patient had endoscopic investigations that were negative for any microscopic lesions causing his iron deficiency.  He has not presented any anemia and in fact his repeat hemoglobin before undergoing EGD and colonoscopy was higher than before.  He had mild gastritis that may require only treatment if he becomes symptomatic or if his iron stores continue to decrease.  We will need to continue taking his iron supplementation.  The patient understood  and agreed.  - Continue oral iron supplementation - Can take pantoprazole if low iron persists or if you develop new symptoms sucha as abdominal pain or heartburn  All questions were answered.      Harvel Quale, MD Gastroenterology and Hepatology [facility]

## 2020-11-22 DIAGNOSIS — D2261 Melanocytic nevi of right upper limb, including shoulder: Secondary | ICD-10-CM | POA: Diagnosis not present

## 2020-11-22 DIAGNOSIS — I129 Hypertensive chronic kidney disease with stage 1 through stage 4 chronic kidney disease, or unspecified chronic kidney disease: Secondary | ICD-10-CM | POA: Diagnosis not present

## 2020-11-22 DIAGNOSIS — L821 Other seborrheic keratosis: Secondary | ICD-10-CM | POA: Diagnosis not present

## 2020-11-22 DIAGNOSIS — E559 Vitamin D deficiency, unspecified: Secondary | ICD-10-CM | POA: Diagnosis not present

## 2020-11-22 DIAGNOSIS — Z85828 Personal history of other malignant neoplasm of skin: Secondary | ICD-10-CM | POA: Diagnosis not present

## 2020-11-22 DIAGNOSIS — N1832 Chronic kidney disease, stage 3b: Secondary | ICD-10-CM | POA: Diagnosis not present

## 2020-11-22 DIAGNOSIS — I5022 Chronic systolic (congestive) heart failure: Secondary | ICD-10-CM | POA: Diagnosis not present

## 2020-11-29 DIAGNOSIS — K862 Cyst of pancreas: Secondary | ICD-10-CM | POA: Diagnosis not present

## 2020-11-29 DIAGNOSIS — I129 Hypertensive chronic kidney disease with stage 1 through stage 4 chronic kidney disease, or unspecified chronic kidney disease: Secondary | ICD-10-CM | POA: Diagnosis not present

## 2020-11-29 DIAGNOSIS — I5022 Chronic systolic (congestive) heart failure: Secondary | ICD-10-CM | POA: Diagnosis not present

## 2020-11-29 DIAGNOSIS — E559 Vitamin D deficiency, unspecified: Secondary | ICD-10-CM | POA: Diagnosis not present

## 2020-11-29 DIAGNOSIS — N1832 Chronic kidney disease, stage 3b: Secondary | ICD-10-CM | POA: Diagnosis not present

## 2020-11-30 ENCOUNTER — Encounter: Payer: Self-pay | Admitting: Cardiology

## 2020-11-30 ENCOUNTER — Ambulatory Visit (INDEPENDENT_AMBULATORY_CARE_PROVIDER_SITE_OTHER): Payer: Medicare Other | Admitting: Cardiology

## 2020-11-30 VITALS — BP 126/80 | HR 68 | Ht 69.0 in | Wt 167.8 lb

## 2020-11-30 DIAGNOSIS — I10 Essential (primary) hypertension: Secondary | ICD-10-CM

## 2020-11-30 DIAGNOSIS — E782 Mixed hyperlipidemia: Secondary | ICD-10-CM

## 2020-11-30 DIAGNOSIS — N1832 Chronic kidney disease, stage 3b: Secondary | ICD-10-CM

## 2020-11-30 DIAGNOSIS — I25119 Atherosclerotic heart disease of native coronary artery with unspecified angina pectoris: Secondary | ICD-10-CM | POA: Diagnosis not present

## 2020-11-30 NOTE — Patient Instructions (Signed)

## 2020-11-30 NOTE — Progress Notes (Signed)
Cardiology Office Note  Date: 11/30/2020   ID: Craig Forbes, DOB 08-17-1945, MRN [id number]  PCP:  Monico Blitz, MD  Cardiologist:  Rozann Lesches, MD Electrophysiologist:  None   Chief Complaint  Patient presents with  . Cardiac follow-up    History of Present Illness: Craig Forbes is a 75 y.o. male last seen in November 2021.  He is here for a follow-up visit.  Since last encounter he does not report any active angina symptoms.  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  We went over his medications which are outlined below.  He reports no intolerances.  No bleeding problems on aspirin and Plavix.  Echocardiogram in December 2021 showed improvement in LVEF to the range of 45 to 50%, previously 40%.  His most recent LDL had come down to 51 on high-dose Lipitor.  He had follow-up lab work with Dr. Theador Hawthorne, creatinine 1.72.  We did discuss a regular walking plan for exercise.  Past Medical History:  Diagnosis Date  . Anxiety   . Asthma   . BPH (benign prostatic hyperplasia)   . CAD (coronary artery disease)    Status post CABG with SVG to OM 2, SVG to ramus, and LIMA to LAD September 2021 [facility]  . Cardiomyopathy ([place])    LVEF 40-45% at time of CABG September 2021  . CKD (chronic kidney disease) stage 3, GFR 30-59 ml/min (HCC)   . Essential hypertension   . Melanoma ([place])   . NSTEMI (non-ST elevated myocardial infarction) [facility])    August 2021  . Postoperative atrial fibrillation [facility])     Past Surgical History:  Procedure Laterality Date  . BIOPSY  09/21/2020   Procedure: BIOPSY;  Surgeon: Harvel Quale, MD;  [facility];  Service: Gastroenterology;;  duodenal gastric  . COLONOSCOPY  03/11/2012   Procedure: COLONOSCOPY;  Surgeon: Rogene Houston, MD;  [facility];  Service: Endoscopy;  Laterality: N/A;  730  . COLONOSCOPY WITH PROPOFOL N/A 09/21/2020   Procedure: COLONOSCOPY WITH PROPOFOL;  Surgeon: Harvel Quale, MD;  [facility];  Service: Gastroenterology;  Laterality: N/A;  9:15  . CORONARY ARTERY BYPASS GRAFT  03/28/2020   [place]  . ESOPHAGOGASTRODUODENOSCOPY (EGD) WITH PROPOFOL N/A 09/21/2020   Procedure: ESOPHAGOGASTRODUODENOSCOPY (EGD) WITH PROPOFOL;  Surgeon: Harvel Quale, MD;  [facility];  Service: Gastroenterology;  Laterality: N/A;  . POLYPECTOMY  09/21/2020   Procedure: POLYPECTOMY INTESTINAL;  Surgeon: Harvel Quale, MD;  [facility];  Service: Gastroenterology;;    Current Outpatient Medications  Medication Sig Dispense Refill  . aspirin EC 81 MG tablet Take 81 mg by mouth daily.    [name] atorvastatin (LIPITOR) 80 MG tablet Take 80 mg by mouth daily.    . Cholecalciferol 25 MCG (1000 UT) CHEW Chew 1,000 Units by mouth daily.    . clopidogrel (PLAVIX) 75 MG tablet Take 75 mg by mouth daily.    . ferrous sulfate 325 (65 FE) MG tablet Take 325 mg by mouth 3 (three) times daily.    . finasteride (PROSCAR) 5 MG tablet Take 5 mg by mouth daily.    [name] losartan (COZAAR) 25 MG tablet Take 25 mg by mouth daily.    . metoprolol tartrate (LOPRESSOR) 25 MG tablet Take 25 mg by mouth 2 (two) times daily.    . SENNA PO Take 50 mg by mouth 2 (two) times daily.     No  current facility-administered medications for this visit.   Allergies:  Patient has no known allergies.   ROS: No syncope.  Physical Exam: VS:  BP 126/80   Pulse 68   Ht 5\' 9"  (1.753 m)   Wt 167 lb 12.8 oz (76.1 kg)   SpO2 98%   BMI 24.78 kg/m , BMI Body mass index is 24.78 kg/m.  Wt Readings from Last 3 Encounters:  11/30/20 167 lb 12.8 oz (76.1 kg)  11/12/20 167 lb (75.8 kg)  09/19/20 165 lb (74.8 kg)    General: Patient appears comfortable at rest. HEENT: Conjunctiva and lids normal, wearing a mask. Neck: Supple, no elevated JVP or carotid bruits, no thyromegaly. Lungs: Clear to auscultation, nonlabored breathing at rest. Cardiac: Regular rate  and rhythm, no S3 or significant systolic murmur, no pericardial rub. Extremities: No pitting edema.  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  Recent Labwork: 09/19/2020: Hemoglobin 15.4; Platelets 205     Component Value Date/Time   CHOL 109 06/01/2020 0806   TRIG 98 06/01/2020 0806   HDL 40 06/01/2020 0806   CHOLHDL 2.7 06/01/2020 0806   LDLCALC 51 06/01/2020 0806    Other Studies Reviewed Today:  Cardiac catheterization 03/28/2020 [facility]): Cath shows severe CAD. LM has an 80% ostial stenosis. LAD has a 70% mid  stenosis. LCX has a 95% proximal stenosis. OM1 is 100% occluded. RCA has  mild non-obstructive CAD. With reduced EF and LM lesion, CABG is  recommended.   Echocardiogram 07/03/2020: 1. Left ventricular ejection fraction, by estimation, is 45 to 50%. The  left ventricle has mildly decreased function. The left ventricle  demonstrates regional wall motion abnormalities (see scoring  diagram/findings for description). Left ventricular  diastolic parameters are consistent with Grade I diastolic dysfunction  (impaired relaxation). There is hypokinesis of the left ventricular,  anteroseptal wall.  2. Right ventricular systolic function is normal. The right ventricular  size is normal.  3. Left atrial size was mildly dilated.  4. The mitral valve is normal in structure. Trivial mitral valve  regurgitation. No evidence of mitral stenosis.  5. The aortic valve is tricuspid. There is mild calcification of the  aortic valve. There is mild thickening of the aortic valve. Aortic valve  regurgitation is not visualized. No aortic stenosis is present.  6. The inferior vena cava is normal in size with greater than 50%  respiratory variability, suggesting right atrial pressure of 3 mmHg.   Assessment and Plan:  1.  Left main/multivessel CAD status post  CABG in September 2021 at [facility].  LVEF up to the range of 45 to 50% by interval echocardiogram.  He is doing well without active angina on medical therapy.  We discussed regular walking plan for exercise.  Continue aspirin, Lipitor, Plavix, Cozaar, and Lopressor.  2.  Mixed hyperlipidemia, tolerating high-dose Lipitor with decrease in LDL to 51.  3.  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  He continues to follow with Dr. Theador Hawthorne.  4.  Essential hypertension, blood pressure control is reasonable.  Continue Cozaar and Lopressor.  Medication Adjustments/Labs and Tests Ordered: Current medicines are reviewed at length with the patient today.  Concerns regarding medicines are outlined above.   Tests Ordered: No orders of the defined types were placed in this encounter.   Medication Changes: No orders of the defined types were placed in this encounter.   Disposition:  Follow up 6 months.  Signed, Satira Sark, MD, [facility] 11/30/2020 4:05 PM    [place] [facility] [place], [place], [place] [address] Phone: [phone]; Fax: [phone]

## 2020-12-04 ENCOUNTER — Other Ambulatory Visit (HOSPITAL_COMMUNITY): Payer: Self-pay | Admitting: Nephrology

## 2020-12-04 ENCOUNTER — Other Ambulatory Visit: Payer: Self-pay | Admitting: Nephrology

## 2020-12-04 DIAGNOSIS — K862 Cyst of pancreas: Secondary | ICD-10-CM

## 2020-12-04 DIAGNOSIS — I129 Hypertensive chronic kidney disease with stage 1 through stage 4 chronic kidney disease, or unspecified chronic kidney disease: Secondary | ICD-10-CM

## 2020-12-04 DIAGNOSIS — N1832 Chronic kidney disease, stage 3b: Secondary | ICD-10-CM

## 2020-12-18 ENCOUNTER — Ambulatory Visit (HOSPITAL_COMMUNITY)
Admission: RE | Admit: 2020-12-18 | Discharge: 2020-12-18 | Disposition: A | Discharge status: Home / Self Care | Payer: Medicare Other | Source: Ambulatory Visit | Attending: Nephrology | Admitting: Nephrology

## 2020-12-18 DIAGNOSIS — N289 Disorder of kidney and ureter, unspecified: Secondary | ICD-10-CM | POA: Diagnosis not present

## 2020-12-18 DIAGNOSIS — K862 Cyst of pancreas: Secondary | ICD-10-CM | POA: Diagnosis not present

## 2020-12-18 DIAGNOSIS — N261 Atrophy of kidney (terminal): Secondary | ICD-10-CM | POA: Diagnosis not present

## 2020-12-18 DIAGNOSIS — N1832 Chronic kidney disease, stage 3b: Secondary | ICD-10-CM | POA: Diagnosis not present

## 2020-12-18 DIAGNOSIS — K769 Liver disease, unspecified: Secondary | ICD-10-CM | POA: Diagnosis not present

## 2020-12-18 DIAGNOSIS — I129 Hypertensive chronic kidney disease with stage 1 through stage 4 chronic kidney disease, or unspecified chronic kidney disease: Secondary | ICD-10-CM | POA: Diagnosis not present

## 2020-12-18 LAB — POCT I-STAT CREATININE: Creatinine, Ser: 1.8 mg/dL — ABNORMAL HIGH (ref 0.61–1.24)

## 2020-12-18 MED ORDER — GADOBUTROL 1 MMOL/ML IV SOLN
7.0000 mL | Freq: Once | INTRAVENOUS | Status: AC | PRN
  Administered 2020-12-18: 7 mL via INTRAVENOUS

## 2020-12-25 DIAGNOSIS — K2970 Gastritis, unspecified, without bleeding: Secondary | ICD-10-CM | POA: Diagnosis not present

## 2020-12-25 DIAGNOSIS — C439 Malignant melanoma of skin, unspecified: Secondary | ICD-10-CM | POA: Diagnosis not present

## 2020-12-25 DIAGNOSIS — Z299 Encounter for prophylactic measures, unspecified: Secondary | ICD-10-CM | POA: Diagnosis not present

## 2020-12-25 DIAGNOSIS — I10 Essential (primary) hypertension: Secondary | ICD-10-CM | POA: Diagnosis not present

## 2020-12-25 DIAGNOSIS — I25119 Atherosclerotic heart disease of native coronary artery with unspecified angina pectoris: Secondary | ICD-10-CM | POA: Diagnosis not present

## 2021-02-22 DIAGNOSIS — I129 Hypertensive chronic kidney disease with stage 1 through stage 4 chronic kidney disease, or unspecified chronic kidney disease: Secondary | ICD-10-CM | POA: Diagnosis not present

## 2021-02-22 DIAGNOSIS — I5022 Chronic systolic (congestive) heart failure: Secondary | ICD-10-CM | POA: Diagnosis not present

## 2021-02-22 DIAGNOSIS — E559 Vitamin D deficiency, unspecified: Secondary | ICD-10-CM | POA: Diagnosis not present

## 2021-02-22 DIAGNOSIS — N1832 Chronic kidney disease, stage 3b: Secondary | ICD-10-CM | POA: Diagnosis not present

## 2021-03-01 DIAGNOSIS — I5022 Chronic systolic (congestive) heart failure: Secondary | ICD-10-CM | POA: Diagnosis not present

## 2021-03-01 DIAGNOSIS — K862 Cyst of pancreas: Secondary | ICD-10-CM | POA: Diagnosis not present

## 2021-03-01 DIAGNOSIS — I129 Hypertensive chronic kidney disease with stage 1 through stage 4 chronic kidney disease, or unspecified chronic kidney disease: Secondary | ICD-10-CM | POA: Diagnosis not present

## 2021-03-01 DIAGNOSIS — N270 Small kidney, unilateral: Secondary | ICD-10-CM | POA: Diagnosis not present

## 2021-03-01 DIAGNOSIS — N281 Cyst of kidney, acquired: Secondary | ICD-10-CM | POA: Diagnosis not present

## 2021-03-01 DIAGNOSIS — N1832 Chronic kidney disease, stage 3b: Secondary | ICD-10-CM | POA: Diagnosis not present

## 2021-03-06 DIAGNOSIS — R5383 Other fatigue: Secondary | ICD-10-CM | POA: Diagnosis not present

## 2021-03-06 DIAGNOSIS — Z125 Encounter for screening for malignant neoplasm of prostate: Secondary | ICD-10-CM | POA: Diagnosis not present

## 2021-03-06 DIAGNOSIS — Z79899 Other long term (current) drug therapy: Secondary | ICD-10-CM | POA: Diagnosis not present

## 2021-03-06 DIAGNOSIS — Z7189 Other specified counseling: Secondary | ICD-10-CM | POA: Diagnosis not present

## 2021-03-06 DIAGNOSIS — Z1339 Encounter for screening examination for other mental health and behavioral disorders: Secondary | ICD-10-CM | POA: Diagnosis not present

## 2021-03-06 DIAGNOSIS — E7800 Pure hypercholesterolemia, unspecified: Secondary | ICD-10-CM | POA: Diagnosis not present

## 2021-03-06 DIAGNOSIS — I10 Essential (primary) hypertension: Secondary | ICD-10-CM | POA: Diagnosis not present

## 2021-03-06 DIAGNOSIS — Z6824 Body mass index (BMI) 24.0-24.9, adult: Secondary | ICD-10-CM | POA: Diagnosis not present

## 2021-03-06 DIAGNOSIS — Z299 Encounter for prophylactic measures, unspecified: Secondary | ICD-10-CM | POA: Diagnosis not present

## 2021-03-06 DIAGNOSIS — Z1331 Encounter for screening for depression: Secondary | ICD-10-CM | POA: Diagnosis not present

## 2021-03-06 DIAGNOSIS — Z0000 Encounter for general adult medical examination without abnormal findings: Secondary | ICD-10-CM | POA: Diagnosis not present

## 2021-03-27 DIAGNOSIS — N400 Enlarged prostate without lower urinary tract symptoms: Secondary | ICD-10-CM | POA: Diagnosis not present

## 2021-03-27 DIAGNOSIS — I10 Essential (primary) hypertension: Secondary | ICD-10-CM | POA: Diagnosis not present

## 2021-06-06 DIAGNOSIS — I129 Hypertensive chronic kidney disease with stage 1 through stage 4 chronic kidney disease, or unspecified chronic kidney disease: Secondary | ICD-10-CM | POA: Diagnosis not present

## 2021-06-06 DIAGNOSIS — K862 Cyst of pancreas: Secondary | ICD-10-CM | POA: Diagnosis not present

## 2021-06-06 DIAGNOSIS — I5022 Chronic systolic (congestive) heart failure: Secondary | ICD-10-CM | POA: Diagnosis not present

## 2021-06-06 DIAGNOSIS — N1832 Chronic kidney disease, stage 3b: Secondary | ICD-10-CM | POA: Diagnosis not present

## 2021-06-06 DIAGNOSIS — N281 Cyst of kidney, acquired: Secondary | ICD-10-CM | POA: Diagnosis not present

## 2021-06-14 DIAGNOSIS — N1832 Chronic kidney disease, stage 3b: Secondary | ICD-10-CM | POA: Diagnosis not present

## 2021-06-14 DIAGNOSIS — I129 Hypertensive chronic kidney disease with stage 1 through stage 4 chronic kidney disease, or unspecified chronic kidney disease: Secondary | ICD-10-CM | POA: Diagnosis not present

## 2021-06-14 DIAGNOSIS — I5022 Chronic systolic (congestive) heart failure: Secondary | ICD-10-CM | POA: Diagnosis not present

## 2021-06-14 DIAGNOSIS — E559 Vitamin D deficiency, unspecified: Secondary | ICD-10-CM | POA: Diagnosis not present

## 2021-06-17 DIAGNOSIS — Z20828 Contact with and (suspected) exposure to other viral communicable diseases: Secondary | ICD-10-CM | POA: Diagnosis not present

## 2021-06-26 DIAGNOSIS — E785 Hyperlipidemia, unspecified: Secondary | ICD-10-CM | POA: Diagnosis not present

## 2021-06-26 DIAGNOSIS — I10 Essential (primary) hypertension: Secondary | ICD-10-CM | POA: Diagnosis not present

## 2021-07-30 ENCOUNTER — Ambulatory Visit (INDEPENDENT_AMBULATORY_CARE_PROVIDER_SITE_OTHER): Payer: Medicare Other | Admitting: Cardiology

## 2021-07-30 ENCOUNTER — Encounter: Payer: Self-pay | Admitting: Cardiology

## 2021-07-30 VITALS — BP 122/84 | HR 66 | Ht 69.0 in | Wt 171.0 lb

## 2021-07-30 DIAGNOSIS — N1832 Chronic kidney disease, stage 3b: Secondary | ICD-10-CM

## 2021-07-30 DIAGNOSIS — I25119 Atherosclerotic heart disease of native coronary artery with unspecified angina pectoris: Secondary | ICD-10-CM

## 2021-07-30 DIAGNOSIS — E782 Mixed hyperlipidemia: Secondary | ICD-10-CM

## 2021-07-30 MED ORDER — ASPIRIN EC 81 MG PO TBEC: 81.0000 mg | DELAYED_RELEASE_TABLET | Freq: Every day | ORAL | Status: AC

## 2021-07-30 NOTE — Progress Notes (Signed)
Cardiology Office Note  Date: 07/30/2021   ID: ASAF ELMQUIST, DOB 15-Aug-1945, MRN [id number]  PCP:  Monico Blitz, MD  Cardiologist:  Rozann Lesches, MD Electrophysiologist:  None   Chief Complaint  Patient presents with   Cardiac follow-up    History of Present Illness: Craig Forbes is a 76 y.o. male last seen in May 2022.  He is here for a follow-up visit.  Reports doing well without angina symptoms on medical therapy.  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  We went over his medications.  He was told by his PCP at last visit to stop aspirin.  He had an NSTEMI with CABG in September 2021, no PCI.  Since he has completed a year course of DAPT, I actually asked him to stop the Plavix instead and go back on baby aspirin daily.  Otherwise his cardiac regimen is stable.  I reviewed follow-up lab work from November 2022.  Renal function was somewhat better with creatinine down to 1.57.  Echocardiogram from December 2021 showed LVEF 45 to 50% with mild diastolic dysfunction.  Past Medical History:  Diagnosis Date   Anxiety    Asthma    BPH (benign prostatic hyperplasia)    CAD (coronary artery disease)    Status post CABG with SVG to OM 2, SVG to ramus, and LIMA to LAD September 2021 - [facility]   Cardiomyopathy [facility])    LVEF 40-45% at time of CABG September 2021   CKD (chronic kidney disease) stage 3, GFR 30-59 ml/min (HCC)    Essential hypertension    Melanoma ([place])    NSTEMI (non-ST elevated myocardial infarction) [facility])    August 2021   Postoperative atrial fibrillation [facility])     Past Surgical History:  Procedure Laterality Date   BIOPSY  09/21/2020   Procedure: BIOPSY;  Surgeon: Harvel Quale, MD;  [facility];  Service: Gastroenterology;;  duodenal gastric   COLONOSCOPY  03/11/2012   Procedure: COLONOSCOPY;  Surgeon: Rogene Houston, MD;  [facility];  Service: Endoscopy;  Laterality: N/A;  730    COLONOSCOPY WITH PROPOFOL N/A 09/21/2020   Procedure: COLONOSCOPY WITH PROPOFOL;  Surgeon: Harvel Quale, MD;  [facility];  Service: Gastroenterology;  Laterality: N/A;  9:15   CORONARY ARTERY BYPASS GRAFT  03/28/2020   [place]   ESOPHAGOGASTRODUODENOSCOPY (EGD) WITH PROPOFOL N/A 09/21/2020   Procedure: ESOPHAGOGASTRODUODENOSCOPY (EGD) WITH PROPOFOL;  Surgeon: Harvel Quale, MD;  [facility];  Service: Gastroenterology;  Laterality: N/A;   POLYPECTOMY  09/21/2020   Procedure: POLYPECTOMY INTESTINAL;  Surgeon: Montez Morita, Quillian Quince, MD;  [facility];  Service: Gastroenterology;;    Current Outpatient Medications  Medication Sig Dispense Refill   aspirin EC 81 MG tablet Take 1 tablet (81 mg total) by mouth daily.     atorvastatin (LIPITOR) 80 MG tablet Take 80 mg by mouth daily.     Cholecalciferol 25 MCG (1000 UT) CHEW Chew 1,000 Units by mouth daily.     ferrous sulfate 325 (65 FE) MG tablet Take 325 mg by mouth 3 (three) times daily.     finasteride (PROSCAR) 5 MG tablet Take 5 mg by mouth daily.     losartan (COZAAR) 25 MG tablet Take 25 mg by mouth daily.     metoprolol tartrate (LOPRESSOR) 25 MG tablet Take 25 mg by mouth 2 (two) times daily.     SENNA PO Take  50 mg by mouth 2 (two) times daily.     No current facility-administered medications for this visit.   Allergies:  Patient has no known allergies.   ROS: No palpitations or syncope.  Physical Exam: VS:  BP 122/84    Pulse 66    Ht 5\' 9"  (1.753 m)    Wt 171 lb (77.6 kg)    SpO2 96%    BMI 25.25 kg/m , BMI Body mass index is 25.25 kg/m.  Wt Readings from Last 3 Encounters:  07/30/21 171 lb (77.6 kg)  11/30/20 167 lb 12.8 oz (76.1 kg)  11/12/20 167 lb (75.8 kg)    General: Patient appears comfortable at rest. HEENT: Conjunctiva and lids normal, wearing a mask. Neck: Supple, no elevated JVP or carotid bruits, no thyromegaly. Lungs: Clear to auscultation,  nonlabored breathing at rest. Cardiac: Regular rate and rhythm, no S3 or significant systolic murmur, no pericardial rub. Extremities: No pitting edema.  ECG:  An ECG dated 09/19/2020 was personally reviewed today and demonstrated:  Sinus bradycardia with prolonged PR interval and IVCD.  Recent Labwork: 09/19/2020: Hemoglobin 15.4; Platelets 205 12/18/2020: Creatinine, Ser 1.80     Component Value Date/Time   CHOL 109 06/01/2020 0806   TRIG 98 06/01/2020 0806   HDL 40 06/01/2020 0806   CHOLHDL 2.7 06/01/2020 0806   LDLCALC 51 06/01/2020 0806  November 2022: Hemoglobin 15.6, platelets 195, BUN 23, creatinine 1.57, potassium 4.5  Other Studies Reviewed Today:  Cardiac catheterization 03/28/2020 [facility]): Cath shows severe CAD. LM has an 80% ostial stenosis. LAD has a 70% mid  stenosis. LCX has a 95% proximal stenosis. OM1 is 100% occluded. RCA has  mild non-obstructive CAD. With reduced EF and LM lesion, CABG is  recommended.    Echocardiogram 07/03/2020:  1. Left ventricular ejection fraction, by estimation, is 45 to 50%. The  left ventricle has mildly decreased function. The left ventricle  demonstrates regional wall motion abnormalities (see scoring  diagram/findings for description). Left ventricular  diastolic parameters are consistent with Grade I diastolic dysfunction  (impaired relaxation). There is hypokinesis of the left ventricular,  anteroseptal wall.   2. Right ventricular systolic function is normal. The right ventricular  size is normal.   3. Left atrial size was mildly dilated.   4. The mitral valve is normal in structure. Trivial mitral valve  regurgitation. No evidence of mitral stenosis.   5. The aortic valve is tricuspid. There is mild calcification of the  aortic valve. There is mild thickening of the aortic valve. Aortic valve  regurgitation is not visualized. No aortic stenosis is present.   6. The inferior vena cava is normal in size with greater than 50%   respiratory variability, suggesting right atrial pressure of 3 mmHg.   Assessment and Plan:  1.  Left main/multivessel CAD status post CABG in September 2021.  He continues to do well without angina on medical therapy.  Last LVEF 45 to 50%.  As discussed above plan will be to stop Plavix, otherwise continue aspirin, Lopressor, Cozaar, and Lipitor.  We discussed walking for exercise.  2.  Mixed hyperlipidemia, tolerating Lipitor 80 mg daily with last LDL 51.  3.  CKD stage IIIb, last creatinine down to 1.57.  He continues to follow with nephrology.  Medication Adjustments/Labs and Tests Ordered: Current medicines are reviewed at length with the patient today.  Concerns regarding medicines are outlined above.   Tests Ordered: No orders of the defined types were placed in this encounter.  Medication Changes: Meds ordered this encounter  Medications   aspirin EC 81 MG tablet    Sig: Take 1 tablet (81 mg total) by mouth daily.    07/30/21 - will finish out remaining supply of Plavix, then begin ASA 81mg  daily.    Disposition:  Follow up  6 months.  Signed, Satira Sark, MD, [facility] 07/30/2021 4:30 PM    [place] at [place], [place], [place] [address] Phone: [phone]; Fax: [phone]

## 2021-07-30 NOTE — Patient Instructions (Signed)
Medication Instructions:  Craig Forbes out remaining supply of Plavix, then begin Aspirin 81mg  daily  Continue all other medications.     Labwork: none  Testing/Procedures: none  Follow-Up: 6 months   Any Other Special Instructions Will Be Listed Below (If Applicable).   If you need a refill on your cardiac medications before your next appointment, please call your pharmacy.

## 2021-08-20 DIAGNOSIS — Z23 Encounter for immunization: Secondary | ICD-10-CM | POA: Diagnosis not present

## 2021-08-27 DIAGNOSIS — I10 Essential (primary) hypertension: Secondary | ICD-10-CM | POA: Diagnosis not present

## 2021-08-27 DIAGNOSIS — E785 Hyperlipidemia, unspecified: Secondary | ICD-10-CM | POA: Diagnosis not present

## 2021-09-06 DIAGNOSIS — Z299 Encounter for prophylactic measures, unspecified: Secondary | ICD-10-CM | POA: Diagnosis not present

## 2021-09-06 DIAGNOSIS — I25119 Atherosclerotic heart disease of native coronary artery with unspecified angina pectoris: Secondary | ICD-10-CM | POA: Diagnosis not present

## 2021-09-06 DIAGNOSIS — C439 Malignant melanoma of skin, unspecified: Secondary | ICD-10-CM | POA: Diagnosis not present

## 2021-09-06 DIAGNOSIS — N184 Chronic kidney disease, stage 4 (severe): Secondary | ICD-10-CM | POA: Diagnosis not present

## 2021-09-06 DIAGNOSIS — I10 Essential (primary) hypertension: Secondary | ICD-10-CM | POA: Diagnosis not present

## 2021-09-09 DIAGNOSIS — I5022 Chronic systolic (congestive) heart failure: Secondary | ICD-10-CM | POA: Diagnosis not present

## 2021-09-09 DIAGNOSIS — E559 Vitamin D deficiency, unspecified: Secondary | ICD-10-CM | POA: Diagnosis not present

## 2021-09-09 DIAGNOSIS — I129 Hypertensive chronic kidney disease with stage 1 through stage 4 chronic kidney disease, or unspecified chronic kidney disease: Secondary | ICD-10-CM | POA: Diagnosis not present

## 2021-09-09 DIAGNOSIS — N1832 Chronic kidney disease, stage 3b: Secondary | ICD-10-CM | POA: Diagnosis not present

## 2021-09-16 DIAGNOSIS — K862 Cyst of pancreas: Secondary | ICD-10-CM | POA: Diagnosis not present

## 2021-09-16 DIAGNOSIS — N1832 Chronic kidney disease, stage 3b: Secondary | ICD-10-CM | POA: Diagnosis not present

## 2021-09-16 DIAGNOSIS — E559 Vitamin D deficiency, unspecified: Secondary | ICD-10-CM | POA: Diagnosis not present

## 2021-09-16 DIAGNOSIS — I5022 Chronic systolic (congestive) heart failure: Secondary | ICD-10-CM | POA: Diagnosis not present

## 2021-09-16 DIAGNOSIS — I129 Hypertensive chronic kidney disease with stage 1 through stage 4 chronic kidney disease, or unspecified chronic kidney disease: Secondary | ICD-10-CM | POA: Diagnosis not present

## 2021-09-26 DIAGNOSIS — D2262 Melanocytic nevi of left upper limb, including shoulder: Secondary | ICD-10-CM | POA: Diagnosis not present

## 2021-09-26 DIAGNOSIS — Z85828 Personal history of other malignant neoplasm of skin: Secondary | ICD-10-CM | POA: Diagnosis not present

## 2021-09-26 DIAGNOSIS — D1801 Hemangioma of skin and subcutaneous tissue: Secondary | ICD-10-CM | POA: Diagnosis not present

## 2021-09-26 DIAGNOSIS — L738 Other specified follicular disorders: Secondary | ICD-10-CM | POA: Diagnosis not present

## 2021-09-26 DIAGNOSIS — L570 Actinic keratosis: Secondary | ICD-10-CM | POA: Diagnosis not present

## 2021-09-26 DIAGNOSIS — L821 Other seborrheic keratosis: Secondary | ICD-10-CM | POA: Diagnosis not present

## 2021-09-26 DIAGNOSIS — L309 Dermatitis, unspecified: Secondary | ICD-10-CM | POA: Diagnosis not present

## 2021-09-26 DIAGNOSIS — L814 Other melanin hyperpigmentation: Secondary | ICD-10-CM | POA: Diagnosis not present

## 2021-09-26 DIAGNOSIS — Z85820 Personal history of malignant melanoma of skin: Secondary | ICD-10-CM | POA: Diagnosis not present

## 2021-09-26 DIAGNOSIS — D3612 Benign neoplasm of peripheral nerves and autonomic nervous system, upper limb, including shoulder: Secondary | ICD-10-CM | POA: Diagnosis not present

## 2021-09-26 DIAGNOSIS — D485 Neoplasm of uncertain behavior of skin: Secondary | ICD-10-CM | POA: Diagnosis not present

## 2021-11-06 ENCOUNTER — Other Ambulatory Visit: Payer: Self-pay | Admitting: Nephrology

## 2021-11-06 ENCOUNTER — Other Ambulatory Visit (HOSPITAL_COMMUNITY): Payer: Self-pay | Admitting: Nephrology

## 2021-11-06 DIAGNOSIS — I5022 Chronic systolic (congestive) heart failure: Secondary | ICD-10-CM

## 2021-11-06 DIAGNOSIS — N1832 Chronic kidney disease, stage 3b: Secondary | ICD-10-CM

## 2021-11-06 DIAGNOSIS — I129 Hypertensive chronic kidney disease with stage 1 through stage 4 chronic kidney disease, or unspecified chronic kidney disease: Secondary | ICD-10-CM

## 2021-12-10 ENCOUNTER — Other Ambulatory Visit (HOSPITAL_COMMUNITY): Payer: Self-pay | Admitting: Nephrology

## 2021-12-10 ENCOUNTER — Ambulatory Visit (HOSPITAL_COMMUNITY)
Admission: RE | Admit: 2021-12-10 | Discharge: 2021-12-10 | Disposition: A | Discharge status: Home / Self Care | Payer: Medicare Other | Source: Ambulatory Visit | Attending: Nephrology | Admitting: Nephrology

## 2021-12-10 DIAGNOSIS — N261 Atrophy of kidney (terminal): Secondary | ICD-10-CM | POA: Diagnosis not present

## 2021-12-10 DIAGNOSIS — I5022 Chronic systolic (congestive) heart failure: Secondary | ICD-10-CM | POA: Insufficient documentation

## 2021-12-10 DIAGNOSIS — I129 Hypertensive chronic kidney disease with stage 1 through stage 4 chronic kidney disease, or unspecified chronic kidney disease: Secondary | ICD-10-CM | POA: Insufficient documentation

## 2021-12-10 DIAGNOSIS — N1832 Chronic kidney disease, stage 3b: Secondary | ICD-10-CM | POA: Insufficient documentation

## 2021-12-10 DIAGNOSIS — N289 Disorder of kidney and ureter, unspecified: Secondary | ICD-10-CM | POA: Diagnosis not present

## 2021-12-10 DIAGNOSIS — K769 Liver disease, unspecified: Secondary | ICD-10-CM | POA: Diagnosis not present

## 2021-12-10 MED ORDER — GADOBUTROL 1 MMOL/ML IV SOLN
7.0000 mL | Freq: Once | INTRAVENOUS | Status: AC | PRN
  Administered 2021-12-10: 7 mL via INTRAVENOUS

## 2021-12-11 DIAGNOSIS — N1832 Chronic kidney disease, stage 3b: Secondary | ICD-10-CM | POA: Diagnosis not present

## 2021-12-11 DIAGNOSIS — E559 Vitamin D deficiency, unspecified: Secondary | ICD-10-CM | POA: Diagnosis not present

## 2021-12-11 DIAGNOSIS — K862 Cyst of pancreas: Secondary | ICD-10-CM | POA: Diagnosis not present

## 2021-12-11 DIAGNOSIS — I5022 Chronic systolic (congestive) heart failure: Secondary | ICD-10-CM | POA: Diagnosis not present

## 2021-12-11 DIAGNOSIS — I129 Hypertensive chronic kidney disease with stage 1 through stage 4 chronic kidney disease, or unspecified chronic kidney disease: Secondary | ICD-10-CM | POA: Diagnosis not present

## 2021-12-18 DIAGNOSIS — K862 Cyst of pancreas: Secondary | ICD-10-CM | POA: Diagnosis not present

## 2021-12-18 DIAGNOSIS — I5022 Chronic systolic (congestive) heart failure: Secondary | ICD-10-CM | POA: Diagnosis not present

## 2021-12-18 DIAGNOSIS — I129 Hypertensive chronic kidney disease with stage 1 through stage 4 chronic kidney disease, or unspecified chronic kidney disease: Secondary | ICD-10-CM | POA: Diagnosis not present

## 2021-12-18 DIAGNOSIS — N1832 Chronic kidney disease, stage 3b: Secondary | ICD-10-CM | POA: Diagnosis not present

## 2021-12-18 DIAGNOSIS — N281 Cyst of kidney, acquired: Secondary | ICD-10-CM | POA: Diagnosis not present

## 2022-02-10 ENCOUNTER — Ambulatory Visit: Payer: Medicare Other | Admitting: Cardiology

## 2022-03-07 DIAGNOSIS — Z0000 Encounter for general adult medical examination without abnormal findings: Secondary | ICD-10-CM | POA: Diagnosis not present

## 2022-03-07 DIAGNOSIS — Z299 Encounter for prophylactic measures, unspecified: Secondary | ICD-10-CM | POA: Diagnosis not present

## 2022-03-07 DIAGNOSIS — Z1331 Encounter for screening for depression: Secondary | ICD-10-CM | POA: Diagnosis not present

## 2022-03-07 DIAGNOSIS — Z79899 Other long term (current) drug therapy: Secondary | ICD-10-CM | POA: Diagnosis not present

## 2022-03-07 DIAGNOSIS — Z125 Encounter for screening for malignant neoplasm of prostate: Secondary | ICD-10-CM | POA: Diagnosis not present

## 2022-03-07 DIAGNOSIS — R5383 Other fatigue: Secondary | ICD-10-CM | POA: Diagnosis not present

## 2022-03-07 DIAGNOSIS — Z6825 Body mass index (BMI) 25.0-25.9, adult: Secondary | ICD-10-CM | POA: Diagnosis not present

## 2022-03-07 DIAGNOSIS — E7800 Pure hypercholesterolemia, unspecified: Secondary | ICD-10-CM | POA: Diagnosis not present

## 2022-03-07 DIAGNOSIS — Z1339 Encounter for screening examination for other mental health and behavioral disorders: Secondary | ICD-10-CM | POA: Diagnosis not present

## 2022-03-07 DIAGNOSIS — Z7189 Other specified counseling: Secondary | ICD-10-CM | POA: Diagnosis not present

## 2022-03-19 DIAGNOSIS — N281 Cyst of kidney, acquired: Secondary | ICD-10-CM | POA: Diagnosis not present

## 2022-03-19 DIAGNOSIS — K862 Cyst of pancreas: Secondary | ICD-10-CM | POA: Diagnosis not present

## 2022-03-19 DIAGNOSIS — I129 Hypertensive chronic kidney disease with stage 1 through stage 4 chronic kidney disease, or unspecified chronic kidney disease: Secondary | ICD-10-CM | POA: Diagnosis not present

## 2022-03-19 DIAGNOSIS — N1832 Chronic kidney disease, stage 3b: Secondary | ICD-10-CM | POA: Diagnosis not present

## 2022-03-19 DIAGNOSIS — I5022 Chronic systolic (congestive) heart failure: Secondary | ICD-10-CM | POA: Diagnosis not present

## 2022-03-26 DIAGNOSIS — I5022 Chronic systolic (congestive) heart failure: Secondary | ICD-10-CM | POA: Diagnosis not present

## 2022-03-26 DIAGNOSIS — N1832 Chronic kidney disease, stage 3b: Secondary | ICD-10-CM | POA: Diagnosis not present

## 2022-03-26 DIAGNOSIS — K862 Cyst of pancreas: Secondary | ICD-10-CM | POA: Diagnosis not present

## 2022-03-26 DIAGNOSIS — I129 Hypertensive chronic kidney disease with stage 1 through stage 4 chronic kidney disease, or unspecified chronic kidney disease: Secondary | ICD-10-CM | POA: Diagnosis not present

## 2022-03-26 DIAGNOSIS — N281 Cyst of kidney, acquired: Secondary | ICD-10-CM | POA: Diagnosis not present

## 2022-05-07 ENCOUNTER — Encounter: Payer: Self-pay | Admitting: Cardiology

## 2022-05-07 ENCOUNTER — Ambulatory Visit: Payer: Medicare Other | Attending: Cardiology | Admitting: Cardiology

## 2022-05-07 VITALS — BP 116/74 | HR 65 | Ht 70.0 in | Wt 175.2 lb

## 2022-05-07 DIAGNOSIS — N1832 Chronic kidney disease, stage 3b: Secondary | ICD-10-CM | POA: Diagnosis not present

## 2022-05-07 DIAGNOSIS — E782 Mixed hyperlipidemia: Secondary | ICD-10-CM | POA: Diagnosis not present

## 2022-05-07 DIAGNOSIS — I25119 Atherosclerotic heart disease of native coronary artery with unspecified angina pectoris: Secondary | ICD-10-CM | POA: Insufficient documentation

## 2022-05-07 NOTE — Progress Notes (Signed)
Cardiology Office Note  Date: 05/07/2022   ID: [name], DOB 03-May-1946, MRN [id number]  PCP:  Monico Blitz, MD  Cardiologist:  Rozann Lesches, MD Electrophysiologist:  None   Chief Complaint  Patient presents with   Cardiac follow-up    History of Present Illness: Craig Forbes is a 76 y.o. male last seen in January.  He is here for a routine visit.  Doing well without angina on current medical therapy.  He has not been exercising regularly, we did talk about a basic walking plan.  We went over his medications which are stable and outlined below.  He has had no intolerances.  I personally reviewed his ECG today which shows sinus rhythm with prolonged PR interval and IVCD.  He had lab work in August which is outlined below.  Past Medical History:  Diagnosis Date   Anxiety    Asthma    BPH (benign prostatic hyperplasia)    CAD (coronary artery disease)    Status post CABG with SVG to OM 2, SVG to ramus, and LIMA to LAD September 2021 - [facility]   Cardiomyopathy [facility])    LVEF 40-45% at time of CABG September 2021   CKD (chronic kidney disease) stage 3, GFR 30-59 ml/min (HCC)    Essential hypertension    Melanoma ([place])    NSTEMI (non-ST elevated myocardial infarction) [facility])    August 2021   Postoperative atrial fibrillation [facility])     Past Surgical History:  Procedure Laterality Date   BIOPSY  09/21/2020   Procedure: BIOPSY;  Surgeon: Harvel Quale, MD;  [facility];  Service: Gastroenterology;;  duodenal gastric   COLONOSCOPY  03/11/2012   Procedure: COLONOSCOPY;  Surgeon: Rogene Houston, MD;  [facility];  Service: Endoscopy;  Laterality: N/A;  730   COLONOSCOPY WITH PROPOFOL N/A 09/21/2020   Procedure: COLONOSCOPY WITH PROPOFOL;  Surgeon: Harvel Quale, MD;  [facility];  Service: Gastroenterology;  Laterality: N/A;  9:15   CORONARY ARTERY BYPASS GRAFT  03/28/2020   [place]    ESOPHAGOGASTRODUODENOSCOPY (EGD) WITH PROPOFOL N/A 09/21/2020   Procedure: ESOPHAGOGASTRODUODENOSCOPY (EGD) WITH PROPOFOL;  Surgeon: Harvel Quale, MD;  [facility];  Service: Gastroenterology;  Laterality: N/A;   POLYPECTOMY  09/21/2020   Procedure: POLYPECTOMY INTESTINAL;  Surgeon: Montez Morita, Quillian Quince, MD;  [facility];  Service: Gastroenterology;;    Current Outpatient Medications  Medication Sig Dispense Refill   aspirin EC 81 MG tablet Take 1 tablet (81 mg total) by mouth daily.     atorvastatin (LIPITOR) 80 MG tablet Take 80 mg by mouth daily.     Cholecalciferol 25 MCG (1000 UT) CHEW Chew 1,000 Units by mouth daily.     ferrous sulfate 325 (65 FE) MG tablet Take 325 mg by mouth 3 (three) times daily.     finasteride (PROSCAR) 5 MG tablet Take 5 mg by mouth daily.     losartan (COZAAR) 25 MG tablet Take 25 mg by mouth daily.     metoprolol tartrate (LOPRESSOR) 25 MG tablet Take 25 mg by mouth 2 (two) times daily.     SENNA PO Take 50 mg by mouth 2 (two) times daily.     No current facility-administered medications for this visit.   Allergies:  Patient has no known allergies.   ROS:  No syncope.  Physical Exam: VS:  BP 116/74   Pulse 65   Ht '5\' 10"'$  (1.778 m)  Wt 175 lb 3.2 oz (79.5 kg)   SpO2 95%   BMI 25.14 kg/m , BMI Body mass index is 25.14 kg/m.  Wt Readings from Last 3 Encounters:  05/07/22 175 lb 3.2 oz (79.5 kg)  07/30/21 171 lb (77.6 kg)  11/30/20 167 lb 12.8 oz (76.1 kg)    General: Patient appears comfortable at rest. HEENT: Conjunctiva and lids normal, oropharynx clear with moist mucosa. Neck: Supple, no elevated JVP or carotid bruits, no thyromegaly. Lungs: Clear to auscultation, nonlabored breathing at rest. Cardiac: Regular rate and rhythm, no S3 or significant systolic murmur, no pericardial rub. Abdomen: Soft, nontender, no hepatomegaly, bowel sounds present, no guarding or rebound. Extremities: No pitting  edema, distal pulses 2+. Skin: Warm and dry. Musculoskeletal: No kyphosis. Neuropsychiatric: Alert and oriented x3, affect grossly appropriate.  ECG:  An ECG dated 09/19/2020 was personally reviewed today and demonstrated:  Sinus bradycardia with prolonged PR interval and IVCD.  Recent Labwork:    Component Value Date/Time   CHOL 109 06/01/2020 0806   TRIG 98 06/01/2020 0806   HDL 40 06/01/2020 0806   CHOLHDL 2.7 06/01/2020 0806   LDLCALC 51 06/01/2020 0806  August 2023: Hemoglobin 16.0, platelets 207, BUN 21, creatinine 1.73, potassium 4.03 March 2022: TSH 1.94, cholesterol 107, triglycerides 82, HDL 40, LDL 51, hemoglobin 16.2, platelets 201, BUN 22, creatinine 1.65, potassium 4.5, AST 23, ALT 25  Other Studies Reviewed Today:  Cardiac catheterization 03/28/2020 [facility]): Cath shows severe CAD. LM has an 80% ostial stenosis. LAD has a 70% mid  stenosis. LCX has a 95% proximal stenosis. OM1 is 100% occluded. RCA has  mild non-obstructive CAD. With reduced EF and LM lesion, CABG is  recommended.    Echocardiogram 07/03/2020:  1. Left ventricular ejection fraction, by estimation, is 45 to 50%. The  left ventricle has mildly decreased function. The left ventricle  demonstrates regional wall motion abnormalities (see scoring  diagram/findings for description). Left ventricular  diastolic parameters are consistent with Grade I diastolic dysfunction  (impaired relaxation). There is hypokinesis of the left ventricular,  anteroseptal wall.   2. Right ventricular systolic function is normal. The right ventricular  size is normal.   3. Left atrial size was mildly dilated.   4. The mitral valve is normal in structure. Trivial mitral valve  regurgitation. No evidence of mitral stenosis.   5. The aortic valve is tricuspid. There is mild calcification of the  aortic valve. There is mild thickening of the aortic valve. Aortic valve  regurgitation is not visualized. No aortic stenosis is  present.   6. The inferior vena cava is normal in size with greater than 50%  respiratory variability, suggesting right atrial pressure of 3 mmHg.   Assessment and Plan:  1.  Left main/multivessel CAD status post CABG in September 2021.  He reports no angina on medical therapy.  ECG reviewed and stable.  Continue aspirin, Lopressor, Cozaar, and Lipitor.  We will discuss a follow-up echocardiogram around time of his next visit.  Encouraged regular walking plan.  2.  Mixed hyperlipidemia doing well on high-dose Lipitor with recent LDL 51.  3.  CKD stage IIIb, creatinine 1.6-1.7.  He is following with Dr. Theador Hawthorne.  Medication Adjustments/Labs and Tests Ordered: Current medicines are reviewed at length with the patient today.  Concerns regarding medicines are outlined above.   Tests Ordered: Orders Placed This Encounter  Procedures   EKG 12-Lead    Medication Changes: No orders of the defined types were placed in  this encounter.   Disposition:  Follow up  6 months.  Signed, Satira Sark, MD, [facility] 05/07/2022 3:46 PM    [place] at [place], [place], [place] [address] Phone: [phone]; Fax: [phone]

## 2022-05-07 NOTE — Patient Instructions (Addendum)

## 2022-06-25 DIAGNOSIS — K862 Cyst of pancreas: Secondary | ICD-10-CM | POA: Diagnosis not present

## 2022-06-25 DIAGNOSIS — N281 Cyst of kidney, acquired: Secondary | ICD-10-CM | POA: Diagnosis not present

## 2022-06-25 DIAGNOSIS — N1832 Chronic kidney disease, stage 3b: Secondary | ICD-10-CM | POA: Diagnosis not present

## 2022-06-25 DIAGNOSIS — I129 Hypertensive chronic kidney disease with stage 1 through stage 4 chronic kidney disease, or unspecified chronic kidney disease: Secondary | ICD-10-CM | POA: Diagnosis not present

## 2022-06-25 DIAGNOSIS — I5022 Chronic systolic (congestive) heart failure: Secondary | ICD-10-CM | POA: Diagnosis not present

## 2022-07-12 DIAGNOSIS — N1831 Chronic kidney disease, stage 3a: Secondary | ICD-10-CM | POA: Diagnosis not present

## 2022-07-12 DIAGNOSIS — I5022 Chronic systolic (congestive) heart failure: Secondary | ICD-10-CM | POA: Diagnosis not present

## 2022-07-12 DIAGNOSIS — K862 Cyst of pancreas: Secondary | ICD-10-CM | POA: Diagnosis not present

## 2022-07-12 DIAGNOSIS — N281 Cyst of kidney, acquired: Secondary | ICD-10-CM | POA: Diagnosis not present

## 2022-07-12 DIAGNOSIS — I129 Hypertensive chronic kidney disease with stage 1 through stage 4 chronic kidney disease, or unspecified chronic kidney disease: Secondary | ICD-10-CM | POA: Diagnosis not present

## 2022-09-08 DIAGNOSIS — N184 Chronic kidney disease, stage 4 (severe): Secondary | ICD-10-CM | POA: Diagnosis not present

## 2022-09-08 DIAGNOSIS — C439 Malignant melanoma of skin, unspecified: Secondary | ICD-10-CM | POA: Diagnosis not present

## 2022-09-08 DIAGNOSIS — I10 Essential (primary) hypertension: Secondary | ICD-10-CM | POA: Diagnosis not present

## 2022-09-08 DIAGNOSIS — Z299 Encounter for prophylactic measures, unspecified: Secondary | ICD-10-CM | POA: Diagnosis not present

## 2022-09-08 DIAGNOSIS — I25119 Atherosclerotic heart disease of native coronary artery with unspecified angina pectoris: Secondary | ICD-10-CM | POA: Diagnosis not present

## 2022-11-03 DIAGNOSIS — Z85820 Personal history of malignant melanoma of skin: Secondary | ICD-10-CM | POA: Diagnosis not present

## 2022-11-03 DIAGNOSIS — L738 Other specified follicular disorders: Secondary | ICD-10-CM | POA: Diagnosis not present

## 2022-11-03 DIAGNOSIS — D1801 Hemangioma of skin and subcutaneous tissue: Secondary | ICD-10-CM | POA: Diagnosis not present

## 2022-11-03 DIAGNOSIS — L570 Actinic keratosis: Secondary | ICD-10-CM | POA: Diagnosis not present

## 2022-11-03 DIAGNOSIS — L821 Other seborrheic keratosis: Secondary | ICD-10-CM | POA: Diagnosis not present

## 2022-11-03 DIAGNOSIS — Z85828 Personal history of other malignant neoplasm of skin: Secondary | ICD-10-CM | POA: Diagnosis not present

## 2022-11-03 DIAGNOSIS — D3612 Benign neoplasm of peripheral nerves and autonomic nervous system, upper limb, including shoulder: Secondary | ICD-10-CM | POA: Diagnosis not present

## 2022-11-07 DIAGNOSIS — N1831 Chronic kidney disease, stage 3a: Secondary | ICD-10-CM | POA: Diagnosis not present

## 2022-11-07 DIAGNOSIS — I129 Hypertensive chronic kidney disease with stage 1 through stage 4 chronic kidney disease, or unspecified chronic kidney disease: Secondary | ICD-10-CM | POA: Diagnosis not present

## 2022-11-07 DIAGNOSIS — I5022 Chronic systolic (congestive) heart failure: Secondary | ICD-10-CM | POA: Diagnosis not present

## 2022-11-07 DIAGNOSIS — K862 Cyst of pancreas: Secondary | ICD-10-CM | POA: Diagnosis not present

## 2022-11-07 DIAGNOSIS — N281 Cyst of kidney, acquired: Secondary | ICD-10-CM | POA: Diagnosis not present

## 2022-11-14 ENCOUNTER — Other Ambulatory Visit (HOSPITAL_BASED_OUTPATIENT_CLINIC_OR_DEPARTMENT_OTHER): Payer: Self-pay | Admitting: Nephrology

## 2022-11-14 DIAGNOSIS — N1832 Chronic kidney disease, stage 3b: Secondary | ICD-10-CM

## 2022-11-14 DIAGNOSIS — I10 Essential (primary) hypertension: Secondary | ICD-10-CM

## 2022-11-14 DIAGNOSIS — I5022 Chronic systolic (congestive) heart failure: Secondary | ICD-10-CM

## 2022-11-14 DIAGNOSIS — I129 Hypertensive chronic kidney disease with stage 1 through stage 4 chronic kidney disease, or unspecified chronic kidney disease: Secondary | ICD-10-CM | POA: Diagnosis not present

## 2022-11-14 DIAGNOSIS — N281 Cyst of kidney, acquired: Secondary | ICD-10-CM

## 2022-11-14 DIAGNOSIS — K862 Cyst of pancreas: Secondary | ICD-10-CM | POA: Diagnosis not present

## 2022-11-24 NOTE — Progress Notes (Unsigned)
    Cardiology Office Note  Date: 11/25/2022   ID: ERMIN PARISIEN, DOB 10-Aug-1945, MRN [id number]  History of Present Illness: Craig Forbes is a 77 y.o. male last seen in October 2023.  He is here for a routine visit.  Reports no angina, stable NYHA class I dyspnea, no palpitations or syncope.  Has not been exercising regularly, we discussed a walking plan today.  Still primary caregiver for his sister.  We went over his medications, he reports compliance with therapy.  He will have follow-up lab work with PCP this year, last LDL was 51 in August 2023 on Lipitor 80 mg daily.  He is still following with Dr. Wolfgang Phoenix, most recent creatinine 1.62 and potassium 5.3.  Did discuss getting an updated echocardiogram for reevaluation of LVEF.  Physical Exam: VS:  BP 120/70   Pulse 62   Ht 5\' 10"  (1.778 m)   Wt 171 lb (77.6 kg)   SpO2 96%   BMI 24.54 kg/m , BMI Body mass index is 24.54 kg/m.  Wt Readings from Last 3 Encounters:  11/25/22 171 lb (77.6 kg)  05/07/22 175 lb 3.2 oz (79.5 kg)  07/30/21 171 lb (77.6 kg)    General: Patient appears comfortable at rest. HEENT: Conjunctiva and lids normal. Neck: Supple, no elevated JVP or carotid bruits. Lungs: Clear to auscultation, nonlabored breathing at rest. Cardiac: Regular rate and rhythm, no S3 or significant systolic murmur. Extremities: No pitting edema.  ECG:  An ECG dated 05/07/2022 was personally reviewed today and demonstrated:  Sinus rhythm with prolonged PR interval and IVCD.  Labwork:  August 2023: TSH 1.94, cholesterol 107, triglycerides 82, HDL 40, LDL 51, hemoglobin 16.2, platelets 201, BUN 22, creatinine 1.65, potassium 4.5, AST 23, ALT 20 November 2022: Hemoglobin 15.7, platelets 206, BUN 28, creatinine 1.62, potassium 5.3  Other Studies Reviewed Today:  No interval cardiac testing for review today.  Assessment and Plan:  1.  Multivessel CAD status post CABG in September 2021 at [facility] with LIMA to LAD, SVG  to OM 2, and SVG to ramus intermedius.  Echocardiogram in December 2021 revealed LVEF 45 to 50%.  He is symptomatically stable without angina and NYHA class I dyspnea.  Continue aspirin, Lopressor, and Lipitor.  2.  Mixed hyperlipidemia, LDL 51 in August 2023.  He will have repeat lab work with PCP this year.  Continues on Lipitor 80 mg daily.  3.  CKD stage IIIb, creatinine 1.62.  Continues to follow with nephrology.  4.  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  Update echocardiogram for reevaluation.  Currently on Lopressor and Cozaar.  Disposition:  Follow up  6 months.  Signed, Jonelle Sidle, M.D., F.A.C.C. [facility]

## 2022-11-25 ENCOUNTER — Encounter: Payer: Self-pay | Admitting: Cardiology

## 2022-11-25 ENCOUNTER — Ambulatory Visit: Payer: Medicare Other | Attending: Cardiology | Admitting: Cardiology

## 2022-11-25 VITALS — BP 120/70 | HR 62 | Ht 70.0 in | Wt 171.0 lb

## 2022-11-25 DIAGNOSIS — E782 Mixed hyperlipidemia: Secondary | ICD-10-CM | POA: Diagnosis not present

## 2022-11-25 DIAGNOSIS — I5022 Chronic systolic (congestive) heart failure: Secondary | ICD-10-CM | POA: Diagnosis not present

## 2022-11-25 DIAGNOSIS — I25119 Atherosclerotic heart disease of native coronary artery with unspecified angina pectoris: Secondary | ICD-10-CM

## 2022-11-25 DIAGNOSIS — N1832 Chronic kidney disease, stage 3b: Secondary | ICD-10-CM | POA: Diagnosis not present

## 2022-11-25 NOTE — Patient Instructions (Addendum)
Medication Instructions:  Your physician recommends that you continue on your current medications as directed. Please refer to the Current Medication list given to you today.  Labwork: none  Testing/Procedures: Your physician has requested that you have an echocardiogram. Echocardiography is a painless test that uses sound waves to create images of your heart. It provides your doctor with information about the size and shape of your heart and how well your heart's chambers and valves are working. This procedure takes approximately one hour. There are no restrictions for this procedure. Please do NOT wear cologne, perfume, aftershave, or lotions (deodorant is allowed). Please arrive 15 minutes prior to your appointment time.  Follow-Up: Your physician recommends that you schedule a follow-up appointment in: 6 months  Any Other Special Instructions Will Be Listed Below (If Applicable).  If you need a refill on your cardiac medications before your next appointment, please call your pharmacy. 

## 2022-12-04 ENCOUNTER — Ambulatory Visit (HOSPITAL_COMMUNITY): Payer: Medicare Other

## 2022-12-16 DIAGNOSIS — Z299 Encounter for prophylactic measures, unspecified: Secondary | ICD-10-CM | POA: Diagnosis not present

## 2022-12-16 DIAGNOSIS — I10 Essential (primary) hypertension: Secondary | ICD-10-CM | POA: Diagnosis not present

## 2022-12-16 DIAGNOSIS — R634 Abnormal weight loss: Secondary | ICD-10-CM | POA: Diagnosis not present

## 2022-12-18 ENCOUNTER — Ambulatory Visit: Payer: Medicare Other | Attending: Cardiology

## 2022-12-18 DIAGNOSIS — I25119 Atherosclerotic heart disease of native coronary artery with unspecified angina pectoris: Secondary | ICD-10-CM | POA: Insufficient documentation

## 2022-12-18 LAB — ECHOCARDIOGRAM COMPLETE
AR max vel: 2.64 cm2
AV Area VTI: 2.27 cm2
AV Area mean vel: 2.88 cm2
AV Mean grad: 1.6 mmHg
AV Peak grad: 3.3 mmHg
Ao pk vel: 0.9 m/s
Area-P 1/2: 1.74 cm2
Calc EF: 48.2 %
Est EF: 40
MV M vel: 2.88 m/s
MV Peak grad: 33.1 mmHg
S' Lateral: 3.7 cm
Single Plane A2C EF: 46.7 %
Single Plane A4C EF: 49 %

## 2022-12-24 ENCOUNTER — Telehealth: Payer: Self-pay

## 2022-12-24 MED ORDER — METOPROLOL SUCCINATE ER 50 MG PO TB24: 50.0000 mg | ORAL_TABLET | Freq: Every day | ORAL | 3 refills | Status: DC

## 2022-12-24 NOTE — Telephone Encounter (Signed)
Patient notified and verbalized understanding. Patient had no questions or concerns at this time. Pt scheduled with Shawnie Dapper, on 6/25 @ 9 am. PCP copied.

## 2022-12-24 NOTE — Telephone Encounter (Signed)
-----   Message from Ellsworth Lennox, [place] sent at 12/23/2022  8:42 AM EDT ----- Covering for Dr. Diona Browner - Please let the patient know the pumping function of his heart has slightly worsened as this was previously at 45-50% and is now at 40%. Right ventricular function at the lower end of normal as well. He has mild leakage along the mitral valve but no significant valve abnormalities. At this time, would recommend gradual medication adjustments to assist with his cardiomyopathy and to hopefully strengthen his heart. He is currently taking Lopressor 25mg  BID. Would stop this and switch to Toprol-XL 50mg  daily. Would arrange follow-up in 4-6 weeks with Dr. Diona Browner or an APP as would consider adding an SGLT2 inhibitor or switching Losartan to [facility] pending his renal function and BP trend.

## 2022-12-30 ENCOUNTER — Other Ambulatory Visit (HOSPITAL_BASED_OUTPATIENT_CLINIC_OR_DEPARTMENT_OTHER): Payer: Self-pay | Admitting: Nephrology

## 2022-12-30 ENCOUNTER — Ambulatory Visit (HOSPITAL_COMMUNITY)
Admission: RE | Admit: 2022-12-30 | Discharge: 2022-12-30 | Disposition: A | Discharge status: Home / Self Care | Payer: Medicare Other | Source: Ambulatory Visit | Attending: Nephrology | Admitting: Nephrology

## 2022-12-30 DIAGNOSIS — N281 Cyst of kidney, acquired: Secondary | ICD-10-CM

## 2022-12-30 DIAGNOSIS — I10 Essential (primary) hypertension: Secondary | ICD-10-CM

## 2022-12-30 DIAGNOSIS — R935 Abnormal findings on diagnostic imaging of other abdominal regions, including retroperitoneum: Secondary | ICD-10-CM | POA: Diagnosis not present

## 2022-12-30 DIAGNOSIS — N1832 Chronic kidney disease, stage 3b: Secondary | ICD-10-CM

## 2022-12-30 DIAGNOSIS — I5022 Chronic systolic (congestive) heart failure: Secondary | ICD-10-CM | POA: Diagnosis not present

## 2022-12-30 DIAGNOSIS — K8689 Other specified diseases of pancreas: Secondary | ICD-10-CM | POA: Diagnosis not present

## 2022-12-30 MED ORDER — GADOBUTROL 1 MMOL/ML IV SOLN
7.0000 mL | Freq: Once | INTRAVENOUS | Status: AC | PRN
  Administered 2022-12-30: 7 mL via INTRAVENOUS

## 2023-01-20 ENCOUNTER — Telehealth: Payer: Medicare Other | Admitting: Nurse Practitioner

## 2023-01-20 ENCOUNTER — Ambulatory Visit: Payer: Medicare Other | Attending: Nurse Practitioner | Admitting: Nurse Practitioner

## 2023-01-20 ENCOUNTER — Other Ambulatory Visit: Payer: Self-pay

## 2023-01-20 ENCOUNTER — Encounter: Payer: Self-pay | Admitting: Nurse Practitioner

## 2023-01-20 VITALS — BP 130/82 | HR 58 | Ht 70.0 in | Wt 172.0 lb

## 2023-01-20 DIAGNOSIS — I2510 Atherosclerotic heart disease of native coronary artery without angina pectoris: Secondary | ICD-10-CM | POA: Diagnosis not present

## 2023-01-20 DIAGNOSIS — E782 Mixed hyperlipidemia: Secondary | ICD-10-CM | POA: Insufficient documentation

## 2023-01-20 DIAGNOSIS — N1832 Chronic kidney disease, stage 3b: Secondary | ICD-10-CM | POA: Insufficient documentation

## 2023-01-20 DIAGNOSIS — I5022 Chronic systolic (congestive) heart failure: Secondary | ICD-10-CM | POA: Diagnosis not present

## 2023-01-20 MED ORDER — DAPAGLIFLOZIN PROPANEDIOL 10 MG PO TABS: 10.0000 mg | ORAL_TABLET | Freq: Every day | ORAL | 5 refills | Status: DC

## 2023-01-20 NOTE — Telephone Encounter (Signed)
Pt c/o medication issue:  1. Name of Medication: dapagliflozin propanediol (FARXIGA) 10 MG TABS tablet   2. How are you currently taking this medication (dosage and times per day)?  Take 1 tablet (10 mg total) by mouth daily.       3. Are you having a reaction (difficulty breathing--STAT)? No  4. What is your medication issue? Pt stated when he tried to pick up medication after office visit today, pharm told him insurance is requesting a PA. Please advise

## 2023-01-20 NOTE — Patient Instructions (Addendum)
Medication Instructions:  Your physician has recommended you make the following change in your medication:  Tart taking 10 Mg Farxiga daily Continue other medications as prescribed  Labwork: BMET in 1-2 weeks at [facility]  Testing/Procedures: none  Follow-Up: Your physician recommends that you schedule a follow-up appointment in: 2-3 months with Philis Nettle  Any Other Special Instructions Will Be Listed Below (If Applicable).  If you need a refill on your cardiac medications before your next appointment, please call your pharmacy.

## 2023-01-20 NOTE — Progress Notes (Signed)
Cardiology Office Note:  .   Date:  01/20/2023  ID:  Craig Forbes, DOB 08-24-1945, MRN [id number] PCP: Kirstie Peri, MD  [facility] Providers Cardiologist:  Nona Dell, MD    History of Present Illness: .   Craig Forbes is a very pleasant 77 y.o. male with a PMH of multivessel CAD, s/p CABG in 2021 at [facility] (LIMA-LAD, SVG-OM 2, SVG-ramus intermedius), mixed HLD, HFmrEF, and CKD stage 3b (follows Nephrology), who presents today for 4-6 week follow-up.   Last seen by Dr. Diona Browner on April 40, 2024. Was overall doing well, sister is primary caregiver. Echo updated and revealed EF at 40% from 45-50%, mild MR, no other significant valvular abnormalities.  Lopressor 25 mg twice daily switch to Toprol-XL 50 mg daily.  Today he presents for HF follow-up. He states he is doing well. Denies any chest pain, shortness of breath, palpitations, syncope, presyncope, dizziness, orthopnea, PND, swelling or significant weight changes, acute bleeding, or claudication.  SH: Retired from [employer]  Studies Reviewed: .       Echo 11/2022:  1. Left ventricular ejection fraction, by estimation, is 40%. The left  ventricle has moderately decreased function. The left ventricle  demonstrates global hypokinesis. Left ventricular diastolic parameters are  consistent with Grade I diastolic  dysfunction (impaired relaxation).   2. Right ventricular systolic function is low normal. The right  ventricular size is normal. There is normal pulmonary artery systolic  pressure.   3. The mitral valve is abnormal. Mild mitral valve regurgitation. No  evidence of mitral stenosis.   4. The tricuspid valve is abnormal.   5. The aortic valve has an indeterminant number of cusps. There is mild  calcification of the aortic valve. There is mild thickening of the aortic  valve. Aortic valve regurgitation is not visualized. No aortic stenosis is  present.   6. The inferior vena cava is normal in size with  <50% respiratory  variability, suggesting right atrial pressure of 8 mmHg.   Comparison(s): Previous Echo showed LV EF 45-50%, Grade I diastolic  dysfunction, HK of LV anteroseptal wall, mild LAE.   Physical Exam:   VS:  BP 130/82   Pulse (!) 58   Ht 5\' 10"  (1.778 m)   Wt 172 lb (78 kg)   SpO2 97%   BMI 24.68 kg/m    Wt Readings from Last 3 Encounters:  01/20/23 172 lb (78 kg)  11/25/22 171 lb (77.6 kg)  05/07/22 175 lb 3.2 oz (79.5 kg)    GEN: Well nourished, well developed in no acute distress NECK: No JVD; No carotid bruits CARDIAC: S1/S2, RRR, no murmurs, rubs, gallops RESPIRATORY:  Clear to auscultation without rales, wheezing or rhonchi  ABDOMEN: Soft, non-tender, non-distended EXTREMITIES:  No edema; No deformity   ASSESSMENT AND PLAN: .    HFmrEF Stage C, NYHA class I.  Recent echo revealed EF slightly reduced from previous, now at 40%.  Right ventricular function at lower end of normal also.  Will initiate Farxiga 10 mg daily and recheck BMET in 1 to 2 weeks per protocol.  Continue Toprol-XL and losartan.  Depending on BP trends and kidney function, will see if losartan can be switched to [facility] at next office visit. Low sodium diet, fluid restriction <2L, and daily weights encouraged. Educated to contact our office for weight gain of 2 lbs overnight or 5 lbs in one week.  CAD, s/p CABG in 2021 Stable with no anginal symptoms. No indication  for ischemic evaluation.  Continue aspirin, atorvastatin, losartan, and Toprol-XL. Heart healthy diet and regular cardiovascular exercise encouraged.   3. Mixed HLD LDL 51 02/2022.  Continue atorvastatin.  At next office visit, plan to request repeat blood work with his PCP. Heart healthy diet and regular cardiovascular exercise encouraged.   4. CKD stage IIIb Most recent sCr at 1.62 with eGFR at 43.  Avoid nephrotoxic agents.  Will be checking BMET after starting Marcelline Deist as mentioned above.  Continue to follow-up with  nephrology.     Dispo: Follow-up with me or APP in 2-3 months or sooner if anything changes.   Signed, Sharlene Dory, NP

## 2023-01-21 NOTE — Telephone Encounter (Signed)
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

## 2023-01-21 NOTE — Telephone Encounter (Signed)
Farxiga 10 mg rx called to [place] with new 30 day voucher information. Spoke with Apolinar Junes who says rx is ready with $0 copay. Patient informed and verbalized understanding of plan.

## 2023-01-30 DIAGNOSIS — I509 Heart failure, unspecified: Secondary | ICD-10-CM | POA: Diagnosis not present

## 2023-02-03 ENCOUNTER — Telehealth: Payer: Self-pay

## 2023-02-03 ENCOUNTER — Other Ambulatory Visit (HOSPITAL_COMMUNITY): Payer: Self-pay

## 2023-02-03 NOTE — Telephone Encounter (Signed)
Pharmacy Patient Advocate Encounter  Received notification from [facility] that Prior Authorization for Dapagliflozin Propanediol 10mg  has been APPROVED from 02/03/2023 to 02/03/2024.[name]  PA #/Case ID/Reference #: [id number]

## 2023-02-04 NOTE — Telephone Encounter (Signed)
Perfect

## 2023-02-16 NOTE — Telephone Encounter (Signed)
Says he has a coupon for $0 copay for farxiga. Advised that will not work for him since he has medicare. Verbalized understanding.

## 2023-02-16 NOTE — Telephone Encounter (Signed)
Patient would like a call back to discuss this medication again in regards to coupon received. Please advise.

## 2023-02-24 DIAGNOSIS — D631 Anemia in chronic kidney disease: Secondary | ICD-10-CM | POA: Diagnosis not present

## 2023-02-24 DIAGNOSIS — R809 Proteinuria, unspecified: Secondary | ICD-10-CM | POA: Diagnosis not present

## 2023-02-24 DIAGNOSIS — N183 Chronic kidney disease, stage 3 unspecified: Secondary | ICD-10-CM | POA: Diagnosis not present

## 2023-03-04 DIAGNOSIS — I5022 Chronic systolic (congestive) heart failure: Secondary | ICD-10-CM | POA: Diagnosis not present

## 2023-03-04 DIAGNOSIS — N1832 Chronic kidney disease, stage 3b: Secondary | ICD-10-CM | POA: Diagnosis not present

## 2023-03-04 DIAGNOSIS — I129 Hypertensive chronic kidney disease with stage 1 through stage 4 chronic kidney disease, or unspecified chronic kidney disease: Secondary | ICD-10-CM | POA: Diagnosis not present

## 2023-03-04 DIAGNOSIS — E559 Vitamin D deficiency, unspecified: Secondary | ICD-10-CM | POA: Diagnosis not present

## 2023-03-10 DIAGNOSIS — Z1331 Encounter for screening for depression: Secondary | ICD-10-CM | POA: Diagnosis not present

## 2023-03-10 DIAGNOSIS — Z79899 Other long term (current) drug therapy: Secondary | ICD-10-CM | POA: Diagnosis not present

## 2023-03-10 DIAGNOSIS — E7800 Pure hypercholesterolemia, unspecified: Secondary | ICD-10-CM | POA: Diagnosis not present

## 2023-03-10 DIAGNOSIS — I10 Essential (primary) hypertension: Secondary | ICD-10-CM | POA: Diagnosis not present

## 2023-03-10 DIAGNOSIS — Z125 Encounter for screening for malignant neoplasm of prostate: Secondary | ICD-10-CM | POA: Diagnosis not present

## 2023-03-10 DIAGNOSIS — Z1339 Encounter for screening examination for other mental health and behavioral disorders: Secondary | ICD-10-CM | POA: Diagnosis not present

## 2023-03-10 DIAGNOSIS — Z0000 Encounter for general adult medical examination without abnormal findings: Secondary | ICD-10-CM | POA: Diagnosis not present

## 2023-03-10 DIAGNOSIS — Z7189 Other specified counseling: Secondary | ICD-10-CM | POA: Diagnosis not present

## 2023-03-10 DIAGNOSIS — R5383 Other fatigue: Secondary | ICD-10-CM | POA: Diagnosis not present

## 2023-03-10 DIAGNOSIS — Z299 Encounter for prophylactic measures, unspecified: Secondary | ICD-10-CM | POA: Diagnosis not present

## 2023-04-24 ENCOUNTER — Encounter: Payer: Self-pay | Admitting: Nurse Practitioner

## 2023-04-24 ENCOUNTER — Ambulatory Visit: Payer: Medicare Other | Attending: Nurse Practitioner | Admitting: Nurse Practitioner

## 2023-04-24 VITALS — BP 118/72 | HR 59 | Ht 70.0 in | Wt 171.2 lb

## 2023-04-24 DIAGNOSIS — E782 Mixed hyperlipidemia: Secondary | ICD-10-CM | POA: Diagnosis not present

## 2023-04-24 DIAGNOSIS — I5022 Chronic systolic (congestive) heart failure: Secondary | ICD-10-CM | POA: Insufficient documentation

## 2023-04-24 DIAGNOSIS — N1832 Chronic kidney disease, stage 3b: Secondary | ICD-10-CM | POA: Insufficient documentation

## 2023-04-24 DIAGNOSIS — I2510 Atherosclerotic heart disease of native coronary artery without angina pectoris: Secondary | ICD-10-CM | POA: Diagnosis not present

## 2023-04-24 NOTE — Patient Instructions (Addendum)
Medication Instructions:  Continue all current medications.  Labwork: none  Testing/Procedures: none  Follow-Up: 6 months   Any Other Special Instructions Will Be Listed Below (If Applicable).  If you need a refill on your cardiac medications before your next appointment, please call your pharmacy.  

## 2023-04-24 NOTE — Progress Notes (Signed)
Cardiology Office Note:  .   Date:  04/24/2023 ID:  Craig Forbes, DOB 12-21-1945, MRN [id number] PCP: Kirstie Peri, MD  [facility] Providers Cardiologist:  Nona Dell, MD    History of Present Illness: .   Craig Forbes is a very pleasant 77 y.o. male with a PMH of multivessel CAD, s/p CABG in 2021 at [facility] (LIMA-LAD, SVG-OM 2, SVG-ramus intermedius), mixed HLD, HFmrEF, and CKD stage 3b (follows Nephrology), who presents today for 4-6 week follow-up.   Last seen by Dr. Diona Browner on April 40, 2024. Was overall doing well, sister is primary caregiver. Echo updated and revealed EF at 40% from 45-50%, mild MR, no other significant valvular abnormalities.  Lopressor 25 mg twice daily switch to Toprol-XL 50 mg daily.  Today he presents for HF follow-up. He states he is doing well. Denies any chest pain, shortness of breath, palpitations, syncope, presyncope, dizziness, orthopnea, PND, swelling or significant weight changes, acute bleeding, or claudication. Tolerating Farxiga well. BP log shows well controlled BP readings.   SH: Retired from [employer]  Studies Reviewed: [name]   EKG Interpretation Date/Time:  Friday April 24 2023 08:22:35 EDT Ventricular Rate:  63 PR Interval:  240 QRS Duration:  130 QT Interval:  410 QTC Calculation: 419 R Axis:   125  Text Interpretation: Sinus rhythm with 1st degree A-V block Non-specific intra-ventricular conduction block Anterolateral infarct (cited on or before 19-Sep-2020) When compared with ECG of 19-Sep-2020 09:17, No significant change was found Confirmed by Sharlene Dory [phone]) on 04/24/2023 8:36:16 AM    Echo 11/2022:  1. Left ventricular ejection fraction, by estimation, is 40%. The left  ventricle has moderately decreased function. The left ventricle  demonstrates global hypokinesis. Left ventricular diastolic parameters are  consistent with Grade I diastolic  dysfunction (impaired relaxation).   2. Right ventricular  systolic function is low normal. The right  ventricular size is normal. There is normal pulmonary artery systolic  pressure.   3. The mitral valve is abnormal. Mild mitral valve regurgitation. No  evidence of mitral stenosis.   4. The tricuspid valve is abnormal.   5. The aortic valve has an indeterminant number of cusps. There is mild  calcification of the aortic valve. There is mild thickening of the aortic  valve. Aortic valve regurgitation is not visualized. No aortic stenosis is  present.   6. The inferior vena cava is normal in size with <50% respiratory  variability, suggesting right atrial pressure of 8 mmHg.   Comparison(s): Previous Echo showed LV EF 45-50%, Grade I diastolic  dysfunction, HK of LV anteroseptal wall, mild LAE.   Physical Exam:   VS:  BP 118/72   Pulse (!) 59   Ht 5\' 10"  (1.778 m)   Wt 171 lb 3.2 oz (77.7 kg)   SpO2 95%   BMI 24.56 kg/m    Wt Readings from Last 3 Encounters:  04/24/23 171 lb 3.2 oz (77.7 kg)  01/20/23 172 lb (78 kg)  11/25/22 171 lb (77.6 kg)    GEN: Well nourished, well developed in no acute distress NECK: No JVD; No carotid bruits CARDIAC: S1/S2, RRR, no murmurs, rubs, gallops RESPIRATORY:  Clear to auscultation without rales, wheezing or rhonchi  ABDOMEN: Soft, non-tender, non-distended EXTREMITIES:  No edema; No deformity   ASSESSMENT AND PLAN: .    HFmrEF Stage C, NYHA class I. TTE 11/2022 revealed EF slightly reduced from previous, now at 40%. Euvolemic and well compensated on exam. Right ventricular  function at lower end of normal also. BP log shows normal trends with some soft readings, GDMT limited. Continue losartan, Toprol XL, and Farxiga. Low sodium diet, fluid restriction <2L, and daily weights encouraged. Educated to contact our office for weight gain of 2 lbs overnight or 5 lbs in one week.  CAD, s/p CABG in 2021 Stable with no anginal symptoms. No indication for ischemic evaluation.  Continue aspirin, atorvastatin,  losartan, and Toprol-XL. Heart healthy diet and regular cardiovascular exercise encouraged.   3. Mixed HLD LDL 49 02/2023.  Continue atorvastatin. Heart healthy diet and regular cardiovascular exercise encouraged.   4. CKD stage IIIb Most recent sCr at 1.82 with eGFR at 38.  Avoid nephrotoxic agents.   Continue to follow-up with nephrology.     Dispo: Follow-up with Dr. Diona Browner or APP in 6 months or sooner if anything changes.   Signed, Sharlene Dory, NP

## 2023-06-01 ENCOUNTER — Ambulatory Visit: Payer: Medicare Other | Admitting: Cardiology

## 2023-07-01 DIAGNOSIS — I129 Hypertensive chronic kidney disease with stage 1 through stage 4 chronic kidney disease, or unspecified chronic kidney disease: Secondary | ICD-10-CM | POA: Diagnosis not present

## 2023-07-01 DIAGNOSIS — R809 Proteinuria, unspecified: Secondary | ICD-10-CM | POA: Diagnosis not present

## 2023-07-01 DIAGNOSIS — I5022 Chronic systolic (congestive) heart failure: Secondary | ICD-10-CM | POA: Diagnosis not present

## 2023-07-01 DIAGNOSIS — E611 Iron deficiency: Secondary | ICD-10-CM | POA: Diagnosis not present

## 2023-07-01 DIAGNOSIS — D631 Anemia in chronic kidney disease: Secondary | ICD-10-CM | POA: Diagnosis not present

## 2023-07-01 DIAGNOSIS — N1832 Chronic kidney disease, stage 3b: Secondary | ICD-10-CM | POA: Diagnosis not present

## 2023-07-09 DIAGNOSIS — N1832 Chronic kidney disease, stage 3b: Secondary | ICD-10-CM | POA: Diagnosis not present

## 2023-07-09 DIAGNOSIS — I129 Hypertensive chronic kidney disease with stage 1 through stage 4 chronic kidney disease, or unspecified chronic kidney disease: Secondary | ICD-10-CM | POA: Diagnosis not present

## 2023-07-09 DIAGNOSIS — N200 Calculus of kidney: Secondary | ICD-10-CM | POA: Diagnosis not present

## 2023-07-09 DIAGNOSIS — I5042 Chronic combined systolic (congestive) and diastolic (congestive) heart failure: Secondary | ICD-10-CM | POA: Diagnosis not present

## 2023-08-04 ENCOUNTER — Telehealth: Payer: Self-pay | Admitting: Nurse Practitioner

## 2023-08-04 MED ORDER — DAPAGLIFLOZIN PROPANEDIOL 10 MG PO TABS: 10.0000 mg | ORAL_TABLET | Freq: Every day | ORAL | 5 refills | Status: DC

## 2023-08-04 NOTE — Telephone Encounter (Signed)
 Refill

## 2023-08-04 NOTE — Telephone Encounter (Signed)
 1. Which medications need to be refilled? (please list name of each medication and dose if known) Farxiga   2. Which pharmacy/location (including street and city if local pharmacy) is medication to be sent to? Pt is switching this medication to [facility] on [address] in [place].  3. Do they need a 30 day or 90 day supply? 90 Days   He has 12 pills left so he said it is not a rush.

## 2023-10-01 DIAGNOSIS — H353112 Nonexudative age-related macular degeneration, right eye, intermediate dry stage: Secondary | ICD-10-CM | POA: Diagnosis not present

## 2023-10-05 ENCOUNTER — Ambulatory Visit: Payer: Medicare Other | Attending: Nurse Practitioner | Admitting: Nurse Practitioner

## 2023-10-05 ENCOUNTER — Encounter: Payer: Self-pay | Admitting: Nurse Practitioner

## 2023-10-05 VITALS — BP 124/72 | HR 63 | Ht 70.0 in | Wt 177.0 lb

## 2023-10-05 DIAGNOSIS — N1832 Chronic kidney disease, stage 3b: Secondary | ICD-10-CM | POA: Diagnosis not present

## 2023-10-05 DIAGNOSIS — I5022 Chronic systolic (congestive) heart failure: Secondary | ICD-10-CM | POA: Diagnosis present

## 2023-10-05 DIAGNOSIS — I2510 Atherosclerotic heart disease of native coronary artery without angina pectoris: Secondary | ICD-10-CM

## 2023-10-05 DIAGNOSIS — E782 Mixed hyperlipidemia: Secondary | ICD-10-CM | POA: Diagnosis not present

## 2023-10-05 MED ORDER — DAPAGLIFLOZIN PROPANEDIOL 10 MG PO TABS: 10.0000 mg | ORAL_TABLET | Freq: Every day | ORAL | 1 refills | Status: DC

## 2023-10-05 NOTE — Patient Instructions (Addendum)

## 2023-10-05 NOTE — Progress Notes (Unsigned)
 Cardiology Office Note:  .   Date: 10/05/2023 ID:  Thressa Sheller, DOB 1945/10/14, MRN [id number] PCP: Kirstie Peri, MD  [facility] Providers Cardiologist:  Nona Dell, MD    History of Present Illness: .   MICKAL MENO is a very pleasant 78 y.o. male with a PMH of multivessel CAD, s/p CABG in 2021 at [facility] (LIMA-LAD, SVG-OM 2, SVG-ramus intermedius), mixed HLD, HFmrEF, and CKD stage 3b (follows Nephrology), who presents today for scheduled follow-up.   Last seen by Dr. Diona Browner on April 40, 2024. Was overall doing well, sister is primary caregiver. Echo updated and revealed EF at 40% from 45-50%, mild MR, no other significant valvular abnormalities.  Lopressor 25 mg twice daily switch to Toprol-XL 50 mg daily.  I last saw him in June 2024.  Was doing well at the time.    Today he presents for follow-up.  Continues to do well.  Tells me he has seen his eye doctor recently, was diagnosed with macular degeneration a little while ago.  Overall doing well. Denies any chest pain, shortness of breath, palpitations, syncope, presyncope, dizziness, orthopnea, PND, swelling or significant weight changes, acute bleeding, or claudication.  SH: Retired from [employer]  Studies Reviewed: [name]       EKG: EKG is not ordered today.  Echo 11/2022:  1. Left ventricular ejection fraction, by estimation, is 40%. The left  ventricle has moderately decreased function. The left ventricle  demonstrates global hypokinesis. Left ventricular diastolic parameters are  consistent with Grade I diastolic  dysfunction (impaired relaxation).   2. Right ventricular systolic function is low normal. The right  ventricular size is normal. There is normal pulmonary artery systolic  pressure.   3. The mitral valve is abnormal. Mild mitral valve regurgitation. No  evidence of mitral stenosis.   4. The tricuspid valve is abnormal.   5. The aortic valve has an indeterminant number of cusps. There is mild   calcification of the aortic valve. There is mild thickening of the aortic  valve. Aortic valve regurgitation is not visualized. No aortic stenosis is  present.   6. The inferior vena cava is normal in size with <50% respiratory  variability, suggesting right atrial pressure of 8 mmHg.   Comparison(s): Previous Echo showed LV EF 45-50%, Grade I diastolic  dysfunction, HK of LV anteroseptal wall, mild LAE.   Physical Exam:   VS:  BP 124/72   Pulse 63   Ht 5\' 10"  (1.778 m)   Wt 177 lb (80.3 kg)   SpO2 98%   BMI 25.40 kg/m    Wt Readings from Last 3 Encounters:  10/05/23 177 lb (80.3 kg)  04/24/23 171 lb 3.2 oz (77.7 kg)  01/20/23 172 lb (78 kg)    GEN: Well nourished, well developed in no acute distress NECK: No JVD; No carotid bruits CARDIAC: S1/S2, RRR, no murmurs, rubs, gallops RESPIRATORY:  Clear to auscultation without rales, wheezing or rhonchi  ABDOMEN: Soft, non-tender, non-distended EXTREMITIES:  No edema; No deformity   ASSESSMENT AND PLAN: .    HFmrEF Stage C, NYHA class I. TTE 11/2022 revealed EF slightly reduced from previous, now at 40%. Euvolemic and well compensated on exam. Right ventricular function at lower end of normal also. GDMT limited due to his past BP trends. Continue losartan, Toprol XL, and Farxiga. Low sodium diet, fluid restriction <2L, and daily weights encouraged. Educated to contact our office for weight gain of 2 lbs overnight or 5 lbs  in one week.  Care and ED precautions discussed.  Will provide him assistance with [place].  CAD, s/p CABG in 2021 Stable with no anginal symptoms. No indication for ischemic evaluation.  Continue aspirin, atorvastatin, losartan, and Toprol-XL. Heart healthy diet and regular cardiovascular exercise encouraged.   3. Mixed HLD LDL 49 02/2023.  Continue atorvastatin. Heart healthy diet and regular cardiovascular exercise encouraged.   4. CKD stage IIIb Most recent sCr at 1.72, stable.  Avoid nephrotoxic agents.    Continue to follow-up with nephrology.     Dispo: Follow-up with Dr. Diona Browner or APP in 1 year or sooner if anything changes.   Signed, Sharlene Dory, NP

## 2023-10-15 DIAGNOSIS — H353132 Nonexudative age-related macular degeneration, bilateral, intermediate dry stage: Secondary | ICD-10-CM | POA: Diagnosis not present

## 2023-10-15 DIAGNOSIS — H35033 Hypertensive retinopathy, bilateral: Secondary | ICD-10-CM | POA: Diagnosis not present

## 2023-10-15 DIAGNOSIS — H43813 Vitreous degeneration, bilateral: Secondary | ICD-10-CM | POA: Diagnosis not present

## 2023-10-15 DIAGNOSIS — H33312 Horseshoe tear of retina without detachment, left eye: Secondary | ICD-10-CM | POA: Diagnosis not present

## 2023-10-22 DIAGNOSIS — H33312 Horseshoe tear of retina without detachment, left eye: Secondary | ICD-10-CM | POA: Diagnosis not present

## 2023-11-16 ENCOUNTER — Other Ambulatory Visit: Payer: Self-pay | Admitting: Student

## 2023-11-16 DIAGNOSIS — L821 Other seborrheic keratosis: Secondary | ICD-10-CM | POA: Diagnosis not present

## 2023-11-16 DIAGNOSIS — D1801 Hemangioma of skin and subcutaneous tissue: Secondary | ICD-10-CM | POA: Diagnosis not present

## 2023-11-16 DIAGNOSIS — D3612 Benign neoplasm of peripheral nerves and autonomic nervous system, upper limb, including shoulder: Secondary | ICD-10-CM | POA: Diagnosis not present

## 2023-11-16 DIAGNOSIS — L309 Dermatitis, unspecified: Secondary | ICD-10-CM | POA: Diagnosis not present

## 2023-11-16 DIAGNOSIS — Z85828 Personal history of other malignant neoplasm of skin: Secondary | ICD-10-CM | POA: Diagnosis not present

## 2023-11-16 DIAGNOSIS — L918 Other hypertrophic disorders of the skin: Secondary | ICD-10-CM | POA: Diagnosis not present

## 2023-11-16 DIAGNOSIS — Z85820 Personal history of malignant melanoma of skin: Secondary | ICD-10-CM | POA: Diagnosis not present

## 2023-11-16 DIAGNOSIS — L820 Inflamed seborrheic keratosis: Secondary | ICD-10-CM | POA: Diagnosis not present

## 2023-11-30 DIAGNOSIS — H353132 Nonexudative age-related macular degeneration, bilateral, intermediate dry stage: Secondary | ICD-10-CM | POA: Diagnosis not present

## 2023-12-03 IMAGING — MR MR ABDOMEN WO/W CM
19 of 21 series · 45 of 48 positions shown · IV contrast (gadavist)
Comparison: Abdominal MRI 12/18/2020. CT the abdomen and pelvis
04/13/2018.

CLINICAL DATA: 76-year-old male with history of indeterminate
lesion in the upper pole of the left kidney on prior magnetic
resonance examination. Follow-up study.

EXAM:
MRI ABDOMEN WITHOUT AND WITH CONTRAST
TECHNIQUE: Multiplanar multisequence MR imaging of the abdomen was performed
both before and after the administration of intravenous contrast.
CONTRAST:  7mL GADAVIST GADOBUTROL 1 MMOL/ML IV SOLN

[Series 3: ax haste · axial · 6.0mm · 1.25mm/px · z∈[-180,+101]mm · 2 of 40 slices shown]
[im 1/40]
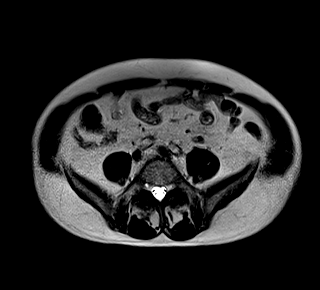
[im 40/40]
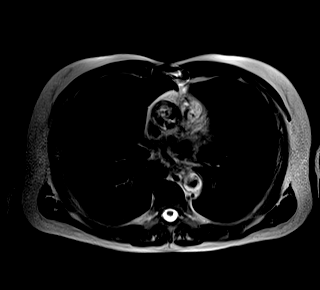

[Series 4: cor haste · coronal · 6.0mm · 1.25mm/px · 2 of 30 slices shown]
[im 1/30]
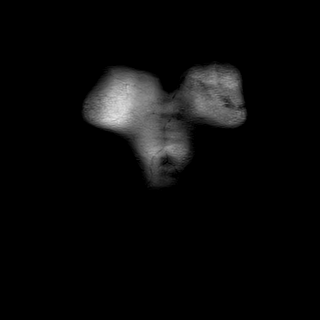
[im 30/30]
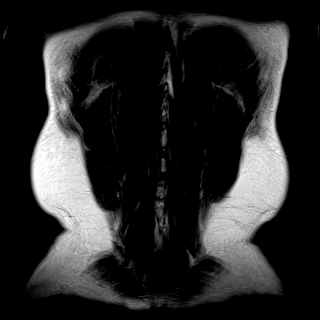

[Series 5: T2 fat-sat · axial · 6.0mm · 1.25mm/px · z∈[-180,+101]mm · 2 of 40 slices shown]
[im 1/40]
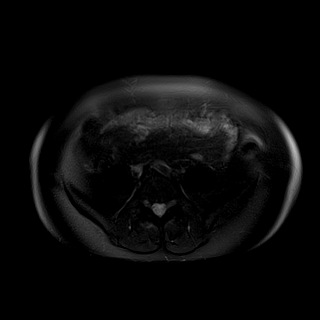
[im 40/40]
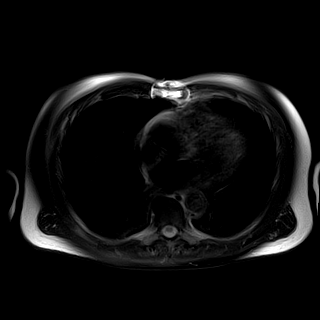

[Series 9: DWI · axial · 6.0mm · 1.42mm/px · 1 of 40 slices shown (1 of 4)]
[im 1/40]
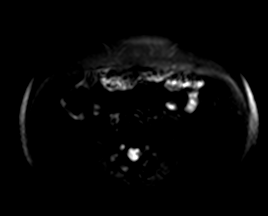

[Series 9: DWI · axial · 6.0mm · 1.42mm/px · 1 of 40 slices shown (2 of 4)]
[im 1/40]
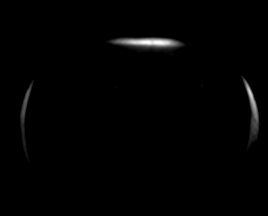

[Series 9: DWI · axial · 6.0mm · 1.42mm/px · 1 of 40 slices shown (3 of 4)]
[im 1/40]
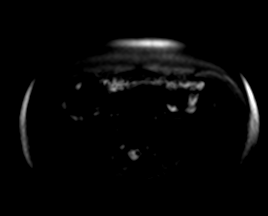

[Series 10: DWI · axial · 6.0mm · 1.42mm/px · 1 of 40 slices shown (4 of 4)]
[im 1/40]
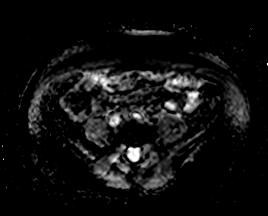

[Series 11: ax in and · axial · 3.0mm · 1.25mm/px · z∈[-170,+91]mm · 3 of 88 slices shown (1 of 2)]
[im 1/88]
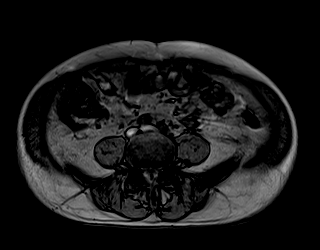
[im 44/88]
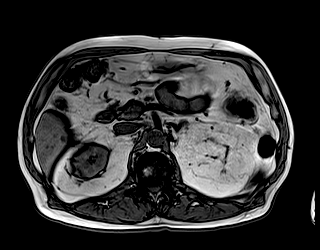
[im 88/88]
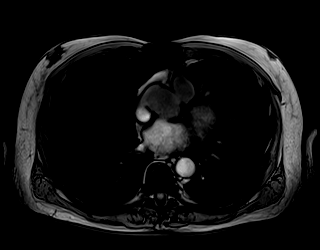

[Series 12: ax in and · axial · 3.0mm · 1.25mm/px · z∈[-170,+91]mm · 3 of 88 slices shown (2 of 2)]
[im 1/88]
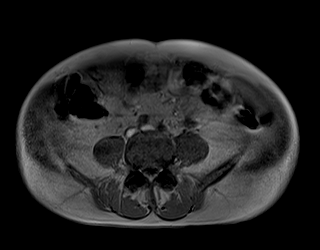
[im 44/88]
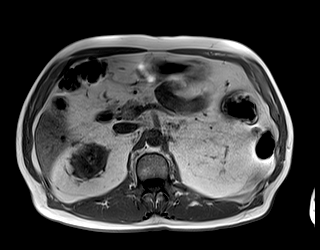
[im 88/88]
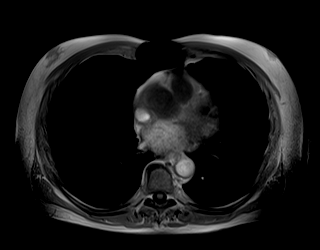

[Series 13: T1 dynamic · axial · non-contrast · 3.0mm · 1.25mm/px · z∈[-170,+91]mm · 3 of 88 slices shown (1 of 4)]
[im 1/88]
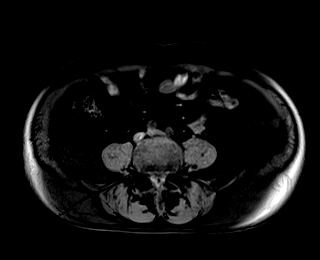
[im 44/88]
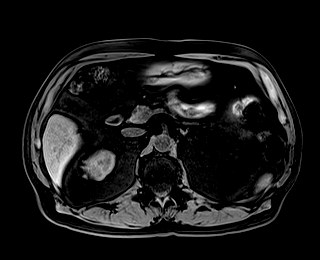
[im 88/88]
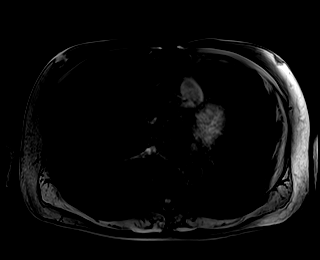

[Series 15: T1 dynamic post-contrast · axial · 3.0mm · 1.25mm/px · z∈[-170,+91]mm · 3 of 88 slices shown (1 of 6)]
[im 1/88]
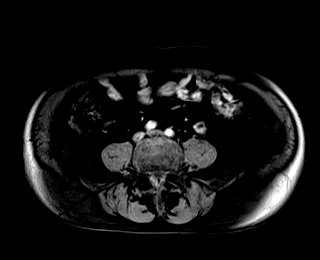
[im 44/88]
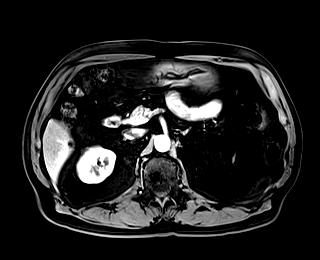
[im 88/88]
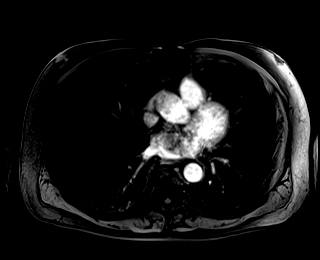

[Series 16: T1 dynamic · axial · 3.0mm · 1.25mm/px · z∈[-170,+91]mm · 3 of 88 slices shown (2 of 4)]
[im 1/88]
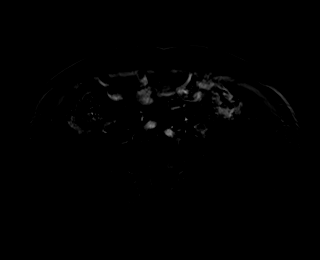
[im 44/88]
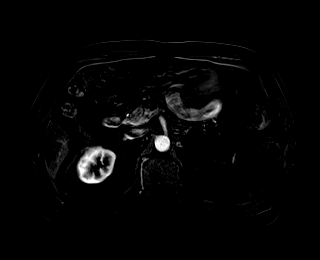
[im 88/88]
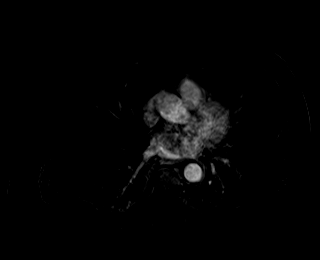

[Series 17: T1 dynamic post-contrast · axial · 3.0mm · 1.25mm/px · z∈[-170,+91]mm · 3 of 88 slices shown (2 of 6)]
[im 1/88]
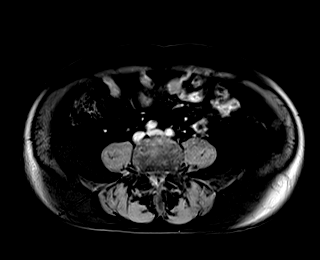
[im 44/88]
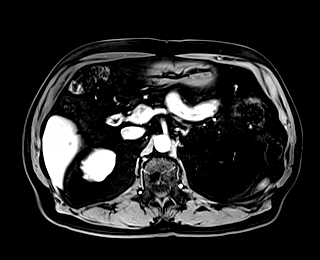
[im 88/88]
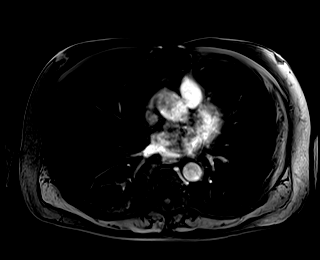

[Series 18: T1 dynamic · axial · 3.0mm · 1.25mm/px · z∈[-170,+91]mm · 3 of 88 slices shown (3 of 4)]
[im 1/88]
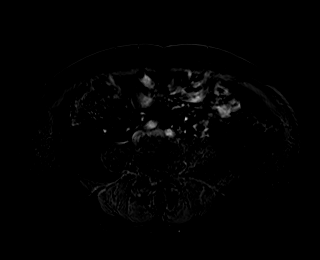
[im 44/88]
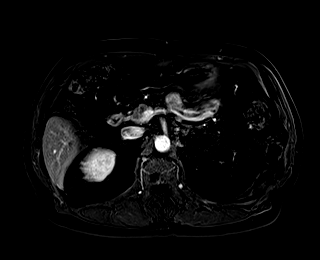
[im 88/88]
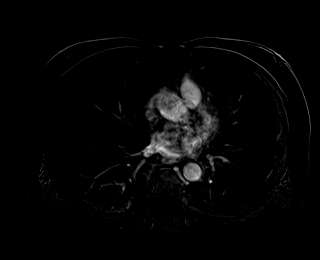

[Series 19: T1 dynamic post-contrast · axial · 3.0mm · 1.25mm/px · z∈[-170,+91]mm · 3 of 88 slices shown (3 of 6)]
[im 1/88]
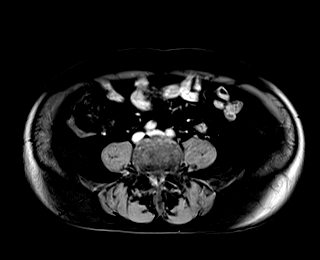
[im 44/88]
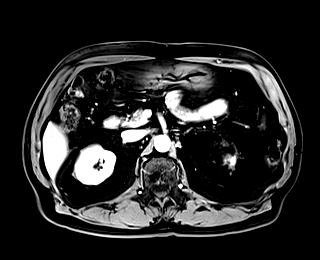
[im 88/88]
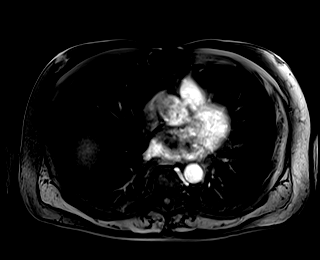

[Series 20: T1 dynamic · axial · 3.0mm · 1.25mm/px · z∈[-170,+91]mm · 3 of 88 slices shown (4 of 4)]
[im 1/88]
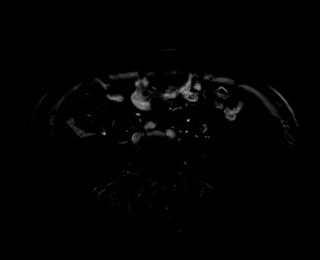
[im 44/88]
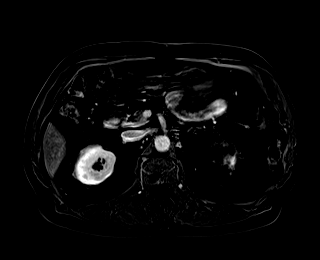
[im 88/88]
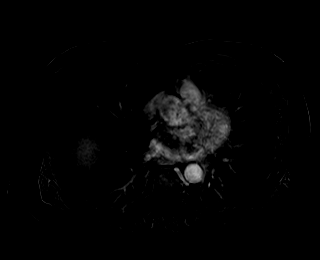

[Series 21: T1 dynamic post-contrast · coronal · 3.0mm · 1.31mm/px · 2 of 72 slices shown (4 of 6)]
[im 1/72]
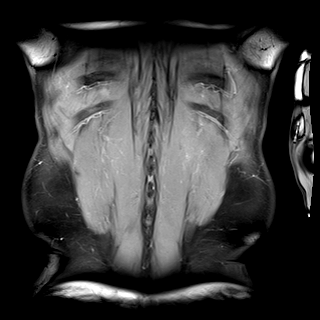
[im 72/72]
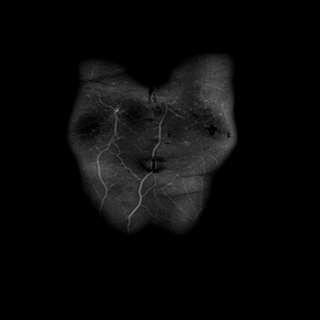

[Series 22: T1 dynamic post-contrast · axial · 3.0mm · 1.25mm/px · z∈[-170,+91]mm · 3 of 88 slices shown (5 of 6)]
[im 1/88]
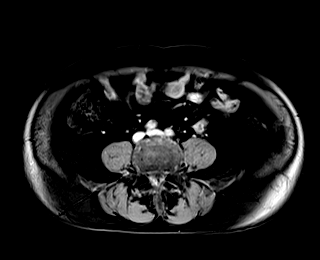
[im 44/88]
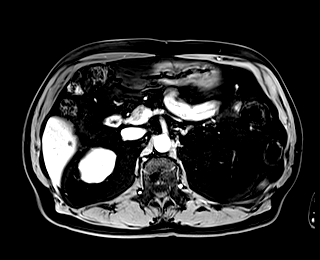
[im 88/88]
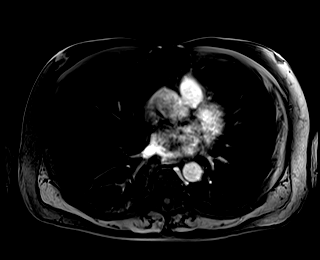

[Series 23: T1 dynamic post-contrast · axial · 3.0mm · 1.25mm/px · z∈[-170,+91]mm · 3 of 88 slices shown (6 of 6)]
[im 1/88]
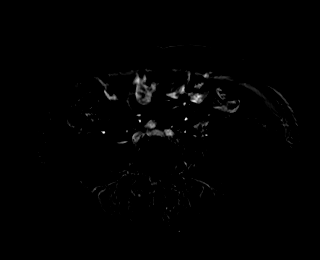
[im 44/88]
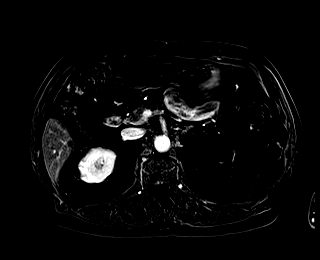
[im 88/88]
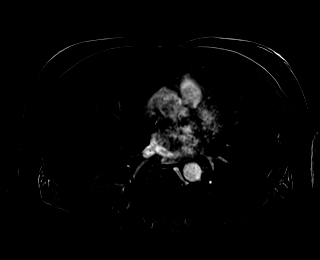

[45 of 48 positions shown; findings below may reference images not displayed]

FINDINGS: Lower chest: Susceptibility artifact in the sternum, presumably from
median sternotomy wires.

Hepatobiliary: Subcentimeter T1 hypointense, T2 hyperintense,
nonenhancing lesions in the liver, similar to the prior study,
compatible with small cysts and/or biliary hamartomas. No suspicious
hepatic lesions. No intra or extrahepatic biliary ductal dilatation.
Gallbladder is normal in appearance.

Pancreas: In the tail of the pancreas (axial image 18 of series 3)
there is a 1.5 x 0.7 cm T1 hypointense, T2 hyperintense nonenhancing
lesion which is stable compared to the prior examination. No other
suspicious pancreatic lesions are noted. No pancreatic ductal
dilatation. No peripancreatic fluid collections or inflammatory
changes.

Spleen:  Unremarkable.

Adrenals/Urinary Tract: In the upper pole of the left kidney there
is again a small well-circumscribed lesion (axial image 23 of series
3) measuring 2.0 x 1.7 cm which is isointense on T1 weighted images,
centrally T2 hyperintense with a peripheral rim of T2 hypointensity
which demonstrates low-level predominantly peripheral enhancement on
post gadolinium imaging, but no focal nodularity or clearly
enlarging area of enhancement on today's study. Subcentimeter T1
hypointense, T2 hyperintense lesions in the right kidney, compatible
with small cysts. Mild left renal atrophy. No hydroureteronephrosis
in the visualized portions of the abdomen. Bilateral adrenal glands
are normal in appearance.

Stomach/Bowel: Visualized portions are unremarkable.

Vascular/Lymphatic: Aortic atherosclerosis. No aneurysm identified
in the visualized abdominal vasculature. No lymphadenopathy noted in
the abdomen.

Other: No significant volume of ascites noted in the visualized
portions of the peritoneal cavity.

Musculoskeletal: No aggressive appearing osseous lesions are noted
in the visualized portions of the skeleton.
IMPRESSION: 1. Previously noted indeterminate lesion in the upper pole of the
left kidney is stable compared to the most recent prior examination,
likely a partially involuted cyst.
2. Stable 1.5 x 0.7 cm cystic lesion in the tail of the pancreas
which does not appear to communicate with the main pancreatic duct,
likely benign. Repeat abdominal MRI with and without IV gadolinium
with MRCP is recommended in 12 months to ensure continued stability.
This recommendation follows ACR consensus guidelines: Management of
Incidental Pancreatic Cysts: A White Paper of the ACR Incidental
Findings Committee. [HOSPITAL] 6790;[DATE].
3. Aortic atherosclerosis.

## 2023-12-29 DIAGNOSIS — D631 Anemia in chronic kidney disease: Secondary | ICD-10-CM | POA: Diagnosis not present

## 2023-12-29 DIAGNOSIS — R809 Proteinuria, unspecified: Secondary | ICD-10-CM | POA: Diagnosis not present

## 2023-12-29 DIAGNOSIS — N189 Chronic kidney disease, unspecified: Secondary | ICD-10-CM | POA: Diagnosis not present

## 2023-12-30 DIAGNOSIS — H353132 Nonexudative age-related macular degeneration, bilateral, intermediate dry stage: Secondary | ICD-10-CM | POA: Diagnosis not present

## 2024-01-04 DIAGNOSIS — E785 Hyperlipidemia, unspecified: Secondary | ICD-10-CM | POA: Diagnosis not present

## 2024-01-04 DIAGNOSIS — N184 Chronic kidney disease, stage 4 (severe): Secondary | ICD-10-CM | POA: Diagnosis not present

## 2024-01-04 DIAGNOSIS — R52 Pain, unspecified: Secondary | ICD-10-CM | POA: Diagnosis not present

## 2024-01-04 DIAGNOSIS — M549 Dorsalgia, unspecified: Secondary | ICD-10-CM | POA: Diagnosis not present

## 2024-01-04 DIAGNOSIS — Z299 Encounter for prophylactic measures, unspecified: Secondary | ICD-10-CM | POA: Diagnosis not present

## 2024-01-04 DIAGNOSIS — R350 Frequency of micturition: Secondary | ICD-10-CM | POA: Diagnosis not present

## 2024-01-04 DIAGNOSIS — C439 Malignant melanoma of skin, unspecified: Secondary | ICD-10-CM | POA: Diagnosis not present

## 2024-01-04 DIAGNOSIS — I10 Essential (primary) hypertension: Secondary | ICD-10-CM | POA: Diagnosis not present

## 2024-01-07 DIAGNOSIS — N200 Calculus of kidney: Secondary | ICD-10-CM | POA: Diagnosis not present

## 2024-01-07 DIAGNOSIS — N1831 Chronic kidney disease, stage 3a: Secondary | ICD-10-CM | POA: Diagnosis not present

## 2024-01-07 DIAGNOSIS — N270 Small kidney, unilateral: Secondary | ICD-10-CM | POA: Diagnosis not present

## 2024-01-07 DIAGNOSIS — I5042 Chronic combined systolic (congestive) and diastolic (congestive) heart failure: Secondary | ICD-10-CM | POA: Diagnosis not present

## 2024-01-29 DIAGNOSIS — H353132 Nonexudative age-related macular degeneration, bilateral, intermediate dry stage: Secondary | ICD-10-CM | POA: Diagnosis not present

## 2024-02-07 ENCOUNTER — Other Ambulatory Visit: Payer: Self-pay | Admitting: Nurse Practitioner

## 2024-02-09 ENCOUNTER — Telehealth: Payer: Self-pay | Admitting: Cardiology

## 2024-02-09 MED ORDER — DAPAGLIFLOZIN PROPANEDIOL 10 MG PO TABS: 10.0000 mg | ORAL_TABLET | Freq: Every day | ORAL | 2 refills | Status: DC

## 2024-02-09 NOTE — Telephone Encounter (Signed)
*  STAT* If patient is at the pharmacy, call can be transferred to refill team.   1. Which medications need to be refilled? (please list name of each medication and dose if known)   dapagliflozin  propanediol (FARXIGA ) 10 MG TABS tablet   2. Would you like to learn more about the convenience, safety, & potential cost savings by using the [facility]?   3. Are you open to using the [facility] (Type [facility]. ).  4. Which pharmacy/location (including street and city if local pharmacy) is medication to be sent to?  [facility] [phone] - [place], [place] - [address] AT [facility] [address]   5. Do they need a 30 day or 90 day supply?   90 day  Patient stated he only has 4 tablets left.

## 2024-02-09 NOTE — Telephone Encounter (Signed)
 Pt's medication was sent to pt's pharmacy as requested. Confirmation received.

## 2024-02-28 DIAGNOSIS — H353132 Nonexudative age-related macular degeneration, bilateral, intermediate dry stage: Secondary | ICD-10-CM | POA: Diagnosis not present

## 2024-03-10 DIAGNOSIS — R079 Chest pain, unspecified: Secondary | ICD-10-CM | POA: Diagnosis not present

## 2024-03-10 DIAGNOSIS — Z79899 Other long term (current) drug therapy: Secondary | ICD-10-CM | POA: Diagnosis not present

## 2024-03-10 DIAGNOSIS — R0789 Other chest pain: Secondary | ICD-10-CM | POA: Diagnosis not present

## 2024-03-11 ENCOUNTER — Telehealth: Payer: Self-pay | Admitting: Cardiology

## 2024-03-11 NOTE — Telephone Encounter (Signed)
 Patient says yesterday he was seen at [facility] and they recommended contacting cardiologist to have a stress test ordered.

## 2024-03-11 NOTE — Telephone Encounter (Signed)
 Per Dr. Debera response: I looked in the chart, there is nothing in Care everywhere that I can locate showing a visit at [facility] with any lab work or other testing. May need to request information. Would start by getting a follow-up visit for him so situation can be discussed further regarding what testing is most appropriate.   Spoke with patient and advised him of Dr. Madalyn recommendation. Advise him will route to our schedulers to call and schedule first available appointment

## 2024-03-15 ENCOUNTER — Ambulatory Visit: Attending: Nurse Practitioner | Admitting: Nurse Practitioner

## 2024-03-15 ENCOUNTER — Encounter: Payer: Self-pay | Admitting: Nurse Practitioner

## 2024-03-15 VITALS — BP 124/76 | HR 70 | Ht 70.0 in | Wt 171.4 lb

## 2024-03-15 DIAGNOSIS — N1832 Chronic kidney disease, stage 3b: Secondary | ICD-10-CM | POA: Diagnosis not present

## 2024-03-15 DIAGNOSIS — I5022 Chronic systolic (congestive) heart failure: Secondary | ICD-10-CM | POA: Insufficient documentation

## 2024-03-15 DIAGNOSIS — E782 Mixed hyperlipidemia: Secondary | ICD-10-CM | POA: Diagnosis not present

## 2024-03-15 DIAGNOSIS — I2510 Atherosclerotic heart disease of native coronary artery without angina pectoris: Secondary | ICD-10-CM | POA: Diagnosis not present

## 2024-03-15 MED ORDER — NITROGLYCERIN 0.4 MG SL SUBL: 0.4000 mg | SUBLINGUAL_TABLET | SUBLINGUAL | 3 refills | Status: AC | PRN

## 2024-03-15 NOTE — Progress Notes (Signed)
 Cardiology Office Note:  .   Date: 03/15/2024 ID:  Sharolyn LITTIE Eagles, DOB 1946/03/17, MRN [id number] PCP: Maree Isles, MD  [facility] Providers Cardiologist:  Jayson Sierras, MD    History of Present Illness: .   Craig Forbes is a very pleasant 78 y.o. male with a PMH of multivessel CAD, s/p CABG in 2021 at [facility] (LIMA-LAD, SVG-OM 2, SVG-ramus intermedius), mixed HLD, HFmrEF, and CKD stage 3b (follows Nephrology), who presents today for scheduled follow-up.   Last seen by Dr. Sierras on April 40, 2024. Was overall doing well, sister is primary caregiver. Echo updated and revealed EF at 40% from 45-50%, mild MR, no other significant valvular abnormalities.  Lopressor  25 mg twice daily switch to Toprol -XL 50 mg daily.  I last saw him in June 2024.  Was doing well at the time.    10/05/2023 - Today he presents for follow-up.  Continues to do well.  Tells me he has seen his eye doctor recently, was diagnosed with macular degeneration a little while ago.  Overall doing well. Denies any chest pain, shortness of breath, palpitations, syncope, presyncope, dizziness, orthopnea, PND, swelling or significant weight changes, acute bleeding, or claudication.  He had an ED visit 03/10/2024 for chest pain at [facility]. Trops negative. Work-up overall unremarkable.   Here for ED follow-up.  He tells me he had chest pain last Thursday, previously in the morning he noted intermittently, nothing severe.  Typically noted when he would bend down, eventually did go to the ED later that evening and left the following morning.  He has had no more chest pain since. Denies any chest pain, shortness of breath, palpitations, syncope, presyncope, dizziness, orthopnea, PND, swelling or significant weight changes, acute bleeding, or claudication.  SH: Retired from [employer]  Studies Reviewed: SABRA       EKG: EKG is not ordered today.  Echo 11/2022:  1. Left ventricular ejection fraction, by estimation,  is 40%. The left  ventricle has moderately decreased function. The left ventricle  demonstrates global hypokinesis. Left ventricular diastolic parameters are  consistent with Grade I diastolic  dysfunction (impaired relaxation).   2. Right ventricular systolic function is low normal. The right  ventricular size is normal. There is normal pulmonary artery systolic  pressure.   3. The mitral valve is abnormal. Mild mitral valve regurgitation. No  evidence of mitral stenosis.   4. The tricuspid valve is abnormal.   5. The aortic valve has an indeterminant number of cusps. There is mild  calcification of the aortic valve. There is mild thickening of the aortic  valve. Aortic valve regurgitation is not visualized. No aortic stenosis is  present.   6. The inferior vena cava is normal in size with <50% respiratory  variability, suggesting right atrial pressure of 8 mmHg.   Comparison(s): Previous Echo showed LV EF 45-50%, Grade I diastolic  dysfunction, HK of LV anteroseptal wall, mild LAE.   Physical Exam:   VS:  BP 124/76 (BP Location: Left Arm, Cuff Size: Normal)   Pulse 70   Ht 5' 10 (1.778 m)   Wt 171 lb 6.4 oz (77.7 kg)   SpO2 95%   BMI 24.59 kg/m    Wt Readings from Last 3 Encounters:  03/15/24 171 lb 6.4 oz (77.7 kg)  10/05/23 177 lb (80.3 kg)  04/24/23 171 lb 3.2 oz (77.7 kg)    GEN: Well nourished, well developed in no acute distress NECK: No JVD; No carotid  bruits CARDIAC: S1/S2, RRR, no murmurs, rubs, gallops RESPIRATORY:  Clear to auscultation without rales, wheezing or rhonchi  ABDOMEN: Soft, non-tender, non-distended EXTREMITIES:  No edema; No deformity   ASSESSMENT AND PLAN: .    HFmrEF Stage C, NYHA class I. TTE 11/2022 revealed EF slightly reduced from previous, now at 40%. Euvolemic and well compensated on exam. Right ventricular function at lower end of normal also. GDMT limited due to his past BP trends. Continue losartan, Toprol  XL, and Farxiga . Low sodium  diet, fluid restriction <2L, and daily weights encouraged. Educated to contact our office for weight gain of 2 lbs overnight or 5 lbs in one week.  Care and ED precautions discussed.  Will prescribe NTG PRN for chest pain. I instructed him on this medication and he verbalized understanding.   CAD, s/p CABG in 2021 Admits to one-time episode of chest pain, typically noted with bending over and no more chest pain since leaving the hospital.  Did discuss but appears to be musculoskeletal related.  Will continue to monitor. No indication for ischemic evaluation.  Continue aspirin , atorvastatin, losartan, and Toprol -XL. Heart healthy diet and regular cardiovascular exercise encouraged. Care and ED precautions discussed.   3. Mixed HLD LDL 49 02/2023.  Continue atorvastatin. Heart healthy diet and regular cardiovascular exercise encouraged.   4. CKD stage IIIb Most recent kidney function normal. Avoid nephrotoxic agents.   Continue to follow-up with nephrology.  I spent a total duration of 30 minutes reviewing prior notes, reviewing outside records including  labs, face-to-face counseling of medical condition, pathophysiology, evaluation, management, and documenting the findings in the note.   Dispo: Follow-up with Dr. Debera or APP in 2-3 months or sooner if anything changes.   Signed, Almarie Crate, NP

## 2024-03-15 NOTE — Patient Instructions (Signed)
 Medication Instructions:  Your physician has recommended you make the following change in your medication:   Start nitroglycerin  as needed for chest pain    *If you need a refill on your cardiac medications before your next appointment, please call your pharmacy*  Lab Work: NONE   If you have labs (blood work) drawn today and your tests are completely normal, you will receive your results only by: MyChart Message (if you have MyChart) OR A paper copy in the mail If you have any lab test that is abnormal or we need to change your treatment, we will call you to review the results.  Testing/Procedures: NONE   Follow-Up: At [facility], you and your health needs are our priority.  As part of our continuing mission to provide you with exceptional heart care, our providers are all part of one team.  This team includes your primary Cardiologist (physician) and Advanced Practice Providers or APPs (Physician Assistants and Nurse Practitioners) who all work together to provide you with the care you need, when you need it.  Your next appointment:   2 -3 month(s)  Provider:   Almarie Crate, NP   We recommend signing up for the patient portal called MyChart.  Sign up information is provided on this After Visit Summary.  MyChart is used to connect with patients for Virtual Visits (Telemedicine).  Patients are able to view lab/test results, encounter notes, upcoming appointments, etc.  Non-urgent messages can be sent to your provider as well.   To learn more about what you can do with MyChart, go to [email or web address].   Other Instructions Thank you for choosing [facility]!    Nitroglycerin  Sublingual Tablets What is this medication? NITROGLYCERIN  (nye troe GLI ser in) prevents and treats chest pain (angina). It works by relaxing blood vessels, which decreases the amount of work the heart has to do. It belongs to a group of medications called nitrates. This  medicine may be used for other purposes; ask your health care provider or pharmacist if you have questions. COMMON BRAND NAME(S): Nitroquick, Nitrostat , Nitrotab What should I tell my care team before I take this medication? They need to know if you have any of these conditions: Anemia Head injury, recent stroke, or bleeding in the brain Liver disease Previous heart attack An unusual or allergic reaction to nitroglycerin , other medications, foods, dyes, or preservatives Pregnant or trying to get pregnant Breast-feeding How should I use this medication? Take this medication by mouth as needed. Use at the first sign of an angina attack (chest pain or tightness). You can also take this medication 5 to 10 minutes before an event likely to produce chest pain. Follow the directions exactly as written on the prescription label. Place one tablet under your tongue and let it dissolve. Do not swallow whole. Replace the dose if you accidentally swallow it. It will help if your mouth is not dry. Saliva around the tablet will help it to dissolve more quickly. Do not eat or drink, smoke or chew tobacco while a tablet is dissolving. Sit down when taking this medication. In an angina attack, you should feel better within 5 minutes after your first dose. You can take a dose every 5 minutes up to a total of 3 doses. If you do not feel better or feel worse after 1 dose, call 9-1-1 at once. Do not take more than 3 doses in 15 minutes. Your care team might give you other directions. Follow those directions if  they do. Do not take your medication more often than directed. Talk to your care team about the use of this medication in children. Special care may be needed. Overdosage: If you think you have taken too much of this medicine contact a poison control center or emergency room at once. NOTE: This medicine is only for you. Do not share this medicine with others. What if I miss a dose? This does not apply. This  medication is only used as needed. What may interact with this medication? Do not take this medication with any of the following: Certain migraine medications, such as ergotamine and dihydroergotamine (DHE) Medications used to treat erectile dysfunction, such as sildenafil, tadalafil, and vardenafil Riociguat This medication may also interact with the following: Alteplase Aspirin  Heparin Medications for blood pressure Medications for depression Other medications used to treat angina Phenothiazines, such as chlorpromazine, mesoridazine, prochlorperazine, thioridazine This list may not describe all possible interactions. Give your health care provider a list of all the medicines, herbs, non-prescription drugs, or dietary supplements you use. Also tell them if you smoke, drink alcohol , or use illegal drugs. Some items may interact with your medicine. What should I watch for while using this medication? Tell your care team if you feel your medication is no longer working. Keep this medication with you at all times. Sit or lie down when you take your medication to prevent falling if you feel dizzy or faint after using it. Try to remain calm. This will help you to feel better faster. If you feel dizzy, take several deep breaths and lie down with your feet propped up, or bend forward with your head resting between your knees. This medication may affect your coordination, reaction time, or judgment. Do not drive or operate machinery until you know how this medication affects you. Sit up or stand slowly to reduce the risk of dizzy or fainting spells. Drinking alcohol  with this medication can increase the risk of these side effects. Do not treat yourself for coughs, colds, or pain while you are taking this medication without asking your care team for advice. Some ingredients may increase your blood pressure. What side effects may I notice from receiving this medication? Side effects that you should report  to your care team as soon as possible: Allergic reactions--skin rash, itching, hives, swelling of the face, lips, tongue, or throat Headache, unusual weakness or fatigue, shortness of breath, nausea, vomiting, rapid heartbeat, blue skin or lips, which may be signs of methemoglobinemia Increased pressure around the brain--severe headache, blurry vision, change in vision, nausea, vomiting Low blood pressure--dizziness, feeling faint or lightheaded, blurry vision Slow heartbeat--dizziness, feeling faint or lightheaded, confusion, trouble breathing, unusual weakness or fatigue Worsening chest pain (angina)--pain, pressure, or tightness in the chest, neck, back, or arms Side effects that usually do not require medical attention (report to your care team if they continue or are bothersome): Dizziness Flushing Headache This list may not describe all possible side effects. Call your doctor for medical advice about side effects. You may report side effects to FDA at 1-800-FDA-1088. Where should I keep my medication? Keep out of the reach of children. Store at room temperature between 20 and 25 degrees C (68 and 77 degrees F). Store in Retail buyer. Protect from light and moisture. Keep tightly closed. Throw away any unused medication after the expiration date. NOTE: This sheet is a summary. It may not cover all possible information. If you have questions about this medicine, talk to your doctor, pharmacist,  or health care provider.  2024 Elsevier/Gold Standard (2022-07-14 00:00:00)

## 2024-03-22 DIAGNOSIS — I25119 Atherosclerotic heart disease of native coronary artery with unspecified angina pectoris: Secondary | ICD-10-CM | POA: Diagnosis not present

## 2024-03-22 DIAGNOSIS — I10 Essential (primary) hypertension: Secondary | ICD-10-CM | POA: Diagnosis not present

## 2024-03-22 DIAGNOSIS — Z299 Encounter for prophylactic measures, unspecified: Secondary | ICD-10-CM | POA: Diagnosis not present

## 2024-03-22 DIAGNOSIS — R0789 Other chest pain: Secondary | ICD-10-CM | POA: Diagnosis not present

## 2024-03-22 DIAGNOSIS — N184 Chronic kidney disease, stage 4 (severe): Secondary | ICD-10-CM | POA: Diagnosis not present

## 2024-03-29 DIAGNOSIS — H353132 Nonexudative age-related macular degeneration, bilateral, intermediate dry stage: Secondary | ICD-10-CM | POA: Diagnosis not present

## 2024-04-05 DIAGNOSIS — Z1339 Encounter for screening examination for other mental health and behavioral disorders: Secondary | ICD-10-CM | POA: Diagnosis not present

## 2024-04-05 DIAGNOSIS — Z299 Encounter for prophylactic measures, unspecified: Secondary | ICD-10-CM | POA: Diagnosis not present

## 2024-04-05 DIAGNOSIS — R5383 Other fatigue: Secondary | ICD-10-CM | POA: Diagnosis not present

## 2024-04-05 DIAGNOSIS — I10 Essential (primary) hypertension: Secondary | ICD-10-CM | POA: Diagnosis not present

## 2024-04-05 DIAGNOSIS — Z79899 Other long term (current) drug therapy: Secondary | ICD-10-CM | POA: Diagnosis not present

## 2024-04-05 DIAGNOSIS — Z125 Encounter for screening for malignant neoplasm of prostate: Secondary | ICD-10-CM | POA: Diagnosis not present

## 2024-04-05 DIAGNOSIS — Z7189 Other specified counseling: Secondary | ICD-10-CM | POA: Diagnosis not present

## 2024-04-05 DIAGNOSIS — Z1331 Encounter for screening for depression: Secondary | ICD-10-CM | POA: Diagnosis not present

## 2024-04-05 DIAGNOSIS — Z0000 Encounter for general adult medical examination without abnormal findings: Secondary | ICD-10-CM | POA: Diagnosis not present

## 2024-04-05 DIAGNOSIS — E7800 Pure hypercholesterolemia, unspecified: Secondary | ICD-10-CM | POA: Diagnosis not present

## 2024-04-20 DIAGNOSIS — H353132 Nonexudative age-related macular degeneration, bilateral, intermediate dry stage: Secondary | ICD-10-CM | POA: Diagnosis not present

## 2024-04-20 DIAGNOSIS — H31092 Other chorioretinal scars, left eye: Secondary | ICD-10-CM | POA: Diagnosis not present

## 2024-04-20 DIAGNOSIS — H35033 Hypertensive retinopathy, bilateral: Secondary | ICD-10-CM | POA: Diagnosis not present

## 2024-04-20 DIAGNOSIS — H43813 Vitreous degeneration, bilateral: Secondary | ICD-10-CM | POA: Diagnosis not present

## 2024-04-22 ENCOUNTER — Ambulatory Visit: Admitting: Nurse Practitioner

## 2024-04-28 DIAGNOSIS — H353132 Nonexudative age-related macular degeneration, bilateral, intermediate dry stage: Secondary | ICD-10-CM | POA: Diagnosis not present

## 2024-05-19 ENCOUNTER — Encounter: Payer: Self-pay | Admitting: Nurse Practitioner

## 2024-05-19 ENCOUNTER — Telehealth: Payer: Self-pay | Admitting: Nurse Practitioner

## 2024-05-19 ENCOUNTER — Ambulatory Visit: Attending: Nurse Practitioner | Admitting: Nurse Practitioner

## 2024-05-19 VITALS — BP 108/72 | HR 62 | Ht 70.0 in | Wt 170.2 lb

## 2024-05-19 DIAGNOSIS — I2510 Atherosclerotic heart disease of native coronary artery without angina pectoris: Secondary | ICD-10-CM | POA: Insufficient documentation

## 2024-05-19 DIAGNOSIS — R9431 Abnormal electrocardiogram [ECG] [EKG]: Secondary | ICD-10-CM | POA: Diagnosis not present

## 2024-05-19 DIAGNOSIS — N1832 Chronic kidney disease, stage 3b: Secondary | ICD-10-CM | POA: Insufficient documentation

## 2024-05-19 DIAGNOSIS — E782 Mixed hyperlipidemia: Secondary | ICD-10-CM | POA: Diagnosis not present

## 2024-05-19 DIAGNOSIS — I5020 Unspecified systolic (congestive) heart failure: Secondary | ICD-10-CM | POA: Insufficient documentation

## 2024-05-19 NOTE — Progress Notes (Unsigned)
 Cardiology Office Note:  .   Date: 03/15/2024 ID:  Sharolyn LITTIE Eagles, DOB [birth year], MRN [id number] PCP: Maree Isles, MD  [facility] Providers Cardiologist:  Jayson Sierras, MD    History of Present Illness: .   Craig Forbes is a very pleasant 78 y.o. male with a PMH of multivessel CAD, s/p CABG in 2021 at [facility] (LIMA-LAD, SVG-OM 2, SVG-ramus intermedius), mixed HLD, HFmrEF, and CKD stage 3b (follows Nephrology), who presents today for scheduled follow-up.   Last seen by Dr. Sierras on April 40, 2024. Was overall doing well, sister is primary caregiver. Echo updated and revealed EF at 40% from 45-50%, mild MR, no other significant valvular abnormalities.  Lopressor  25 mg twice daily switch to Toprol -XL 50 mg daily.  I last saw him in June 2024.  Was doing well at the time.    10/05/2023 - Today he presents for follow-up.  Continues to do well.  Tells me he has seen his eye doctor recently, was diagnosed with macular degeneration a little while ago.  Overall doing well. Denies any chest pain, shortness of breath, palpitations, syncope, presyncope, dizziness, orthopnea, PND, swelling or significant weight changes, acute bleeding, or claudication.  He had an ED visit 03/10/2024 for chest pain at [facility]. Trops negative. Work-up overall unremarkable.   Here for ED follow-up.  He tells me he had chest pain last Thursday, previously in the morning he noted intermittently, nothing severe.  Typically noted when he would bend down, eventually did go to the ED later that evening and left the following morning.  He has had no more chest pain since. Denies any chest pain, shortness of breath, palpitations, syncope, presyncope, dizziness, orthopnea, PND, swelling or significant weight changes, acute bleeding, or claudication.  SH: Retired from [employer]  Studies Reviewed: SABRA       EKG: EKG is not ordered today.  Echo 11/2022:  1. Left ventricular ejection fraction, by estimation,  is 40%. The left  ventricle has moderately decreased function. The left ventricle  demonstrates global hypokinesis. Left ventricular diastolic parameters are  consistent with Grade I diastolic  dysfunction (impaired relaxation).   2. Right ventricular systolic function is low normal. The right  ventricular size is normal. There is normal pulmonary artery systolic  pressure.   3. The mitral valve is abnormal. Mild mitral valve regurgitation. No  evidence of mitral stenosis.   4. The tricuspid valve is abnormal.   5. The aortic valve has an indeterminant number of cusps. There is mild  calcification of the aortic valve. There is mild thickening of the aortic  valve. Aortic valve regurgitation is not visualized. No aortic stenosis is  present.   6. The inferior vena cava is normal in size with <50% respiratory  variability, suggesting right atrial pressure of 8 mmHg.   Comparison(s): Previous Echo showed LV EF 45-50%, Grade I diastolic  dysfunction, HK of LV anteroseptal wall, mild LAE.   Physical Exam:   VS:  BP 108/72   Pulse 62   Ht 5' 10 (1.778 m)   Wt 170 lb 3.2 oz (77.2 kg)   SpO2 99%   BMI 24.42 kg/m    Wt Readings from Last 3 Encounters:  05/19/24 170 lb 3.2 oz (77.2 kg)  03/15/24 171 lb 6.4 oz (77.7 kg)  10/05/23 177 lb (80.3 kg)    GEN: Well nourished, well developed in no acute distress NECK: No JVD; No carotid bruits CARDIAC: S1/S2, RRR, no murmurs, rubs,  gallops RESPIRATORY:  Clear to auscultation without rales, wheezing or rhonchi  ABDOMEN: Soft, non-tender, non-distended EXTREMITIES:  No edema; No deformity   ASSESSMENT AND PLAN: .    HFmrEF Stage C, NYHA class I. TTE 11/2022 revealed EF slightly reduced from previous, now at 40%. Euvolemic and well compensated on exam. Right ventricular function at lower end of normal also. GDMT limited due to his past BP trends. Continue losartan, Toprol  XL, and Farxiga . Low sodium diet, fluid restriction <2L, and daily  weights encouraged. Educated to contact our office for weight gain of 2 lbs overnight or 5 lbs in one week.  Care and ED precautions discussed.  Will prescribe NTG PRN for chest pain. I instructed him on this medication and he verbalized understanding.   CAD, s/p CABG in 2021 Admits to one-time episode of chest pain, typically noted with bending over and no more chest pain since leaving the hospital.  Did discuss but appears to be musculoskeletal related.  Will continue to monitor. No indication for ischemic evaluation.  Continue aspirin , atorvastatin, losartan, and Toprol -XL. Heart healthy diet and regular cardiovascular exercise encouraged. Care and ED precautions discussed.   Informed Consent   Shared Decision Making/Informed Consent{ All outpatient stress tests require an informed consent ([id number]) ATTESTATION ORDER       :[id number]} The risks [chest pain, shortness of breath, cardiac arrhythmias, dizziness, blood pressure fluctuations, myocardial infarction, stroke/transient ischemic attack, and life-threatening complications (estimated to be 1 in 10,000)], benefits (risk stratification, diagnosing coronary artery disease, treatment guidance) and alternatives of an exercise tolerance test were discussed in detail with Mr. Radi and he agrees to proceed.        3. Mixed HLD LDL 49 02/2023.  Continue atorvastatin. Heart healthy diet and regular cardiovascular exercise encouraged.   4. CKD stage IIIb Most recent kidney function normal. Avoid nephrotoxic agents.   Continue to follow-up with nephrology.  I spent a total duration of 30 minutes reviewing prior notes, reviewing outside records including  labs, face-to-face counseling of medical condition, pathophysiology, evaluation, management, and documenting the findings in the note.    Arrange ETT Hold metoprolol  morning of.    F/u in 6-8 weeks.      Dispo: Follow-up with Dr. Debera or APP in 2-3 months or sooner if anything  changes.   Signed, Almarie Crate, NP

## 2024-05-19 NOTE — Patient Instructions (Addendum)
 Medication Instructions:  Your physician recommends that you continue on your current medications as directed. Please refer to the Current Medication list given to you today.  Labwork: None   Testing/Procedures: Your physician has requested that you have an exercise tolerance test. For further information please visit [email or web address] Please also follow instruction sheet, as given.  Follow-Up: Your physician recommends that you schedule a follow-up appointment in: 6-8 weeks   Any Other Special Instructions Will Be Listed Below (If Applicable).  If you need a refill on your cardiac medications before your next appointment, please call your pharmacy.

## 2024-05-19 NOTE — Telephone Encounter (Signed)
 Checking percert on the following patient for testing scheduled at [facility].    GXT - 05/23/2024

## 2024-05-23 ENCOUNTER — Ambulatory Visit (HOSPITAL_COMMUNITY)
Admission: RE | Admit: 2024-05-23 | Discharge: 2024-05-23 | Disposition: A | Discharge status: Home / Self Care | Source: Ambulatory Visit | Attending: Nurse Practitioner | Admitting: Nurse Practitioner

## 2024-05-23 ENCOUNTER — Ambulatory Visit: Payer: Self-pay | Admitting: Nurse Practitioner

## 2024-05-23 ENCOUNTER — Other Ambulatory Visit: Payer: Self-pay | Admitting: Physician Assistant

## 2024-05-23 DIAGNOSIS — R9431 Abnormal electrocardiogram [ECG] [EKG]: Secondary | ICD-10-CM | POA: Insufficient documentation

## 2024-05-23 DIAGNOSIS — I2510 Atherosclerotic heart disease of native coronary artery without angina pectoris: Secondary | ICD-10-CM | POA: Diagnosis not present

## 2024-05-23 LAB — EXERCISE TOLERANCE TEST
Angina Index: 0
Base ST Depression (mm): 0 mm
Duke Treadmill Score: 4
Estimated workload: 5.9
Exercise duration (min): 3 min
Exercise duration (sec): 34 s
MPHR: 141 {beats}/min
Peak HR: 136 {beats}/min
Percent HR: 96 %
RPE: 19
Rest HR: 72 {beats}/min
ST Depression (mm): 0 mm

## 2024-05-23 NOTE — Progress Notes (Signed)
     Craig Forbes presented for a treadmill stress test (GXT) today.  I Lorette CINDERELLA Kapur, PA-C, provided direct supervision and was present during the study today, which was completed without significant symptoms, immediate complications, or acute ST/T changes on ECG.  Official results are pending at this time.  Preliminary ECG findings may be listed in the chart, but the stress test result will not be finalized until read by the Cardiologist.  Lorette CINDERELLA Kapur, PA-C  05/23/2024, 9:36 AM

## 2024-05-27 NOTE — Telephone Encounter (Signed)
-----   Message from Almarie Crate sent at 05/23/2024  4:48 PM EDT ----- Normal exercise stress test. Excellent report!  Almarie Crate, AGNP-C ----- Message ----- From: Alvan Dorn FALCON, MD Sent: 05/23/2024   3:14 PM EDT To: Almarie Crate, NP

## 2024-05-27 NOTE — Telephone Encounter (Signed)
 Notified, copy to pcp.

## 2024-05-28 DIAGNOSIS — H353132 Nonexudative age-related macular degeneration, bilateral, intermediate dry stage: Secondary | ICD-10-CM | POA: Diagnosis not present

## 2024-06-16 ENCOUNTER — Ambulatory Visit: Admitting: Nurse Practitioner

## 2024-06-27 DIAGNOSIS — H353132 Nonexudative age-related macular degeneration, bilateral, intermediate dry stage: Secondary | ICD-10-CM | POA: Diagnosis not present

## 2024-07-14 ENCOUNTER — Ambulatory Visit: Admitting: Nurse Practitioner

## 2024-07-20 ENCOUNTER — Ambulatory Visit: Attending: Nurse Practitioner | Admitting: Nurse Practitioner

## 2024-07-20 ENCOUNTER — Encounter: Payer: Self-pay | Admitting: Nurse Practitioner

## 2024-07-20 VITALS — BP 126/72 | HR 66 | Ht 70.0 in | Wt 169.0 lb

## 2024-07-20 DIAGNOSIS — I5020 Unspecified systolic (congestive) heart failure: Secondary | ICD-10-CM | POA: Insufficient documentation

## 2024-07-20 DIAGNOSIS — N1832 Chronic kidney disease, stage 3b: Secondary | ICD-10-CM | POA: Diagnosis present

## 2024-07-20 DIAGNOSIS — I2510 Atherosclerotic heart disease of native coronary artery without angina pectoris: Secondary | ICD-10-CM | POA: Diagnosis present

## 2024-07-20 DIAGNOSIS — E782 Mixed hyperlipidemia: Secondary | ICD-10-CM | POA: Diagnosis present

## 2024-07-20 NOTE — Patient Instructions (Signed)
 Medication Instructions:  Your physician recommends that you continue on your current medications as directed. Please refer to the Current Medication list given to you today.   Labwork: None today  Testing/Procedures: None today  Follow-Up: 6 months  Any Other Special Instructions Will Be Listed Below (If Applicable).  If you need a refill on your cardiac medications before your next appointment, please call your pharmacy.

## 2024-07-20 NOTE — Progress Notes (Signed)
 " Cardiology Office Note:  .   Date: 07/20/2024 ID:  Craig Forbes, DOB 05-01-1946, MRN [id number] PCP: Maree Isles, MD  [facility] Providers Cardiologist:  Jayson Sierras, MD    History of Present Illness: .   Craig Forbes is a very pleasant 78 y.o. male with a PMH of multivessel CAD, s/p CABG in 2021 at [facility] (LIMA-LAD, SVG-OM 2, SVG-ramus intermedius), mixed HLD, HFmrEF, and CKD stage 3b (follows Nephrology), who presents today for scheduled follow-up.   Last seen by Dr. Sierras on April 40, 2024. Was overall doing well, sister is primary caregiver. Echo updated and revealed EF at 40% from 45-50%, mild MR, no other significant valvular abnormalities.  Lopressor  25 mg twice daily switch to Toprol -XL 50 mg daily.  I last saw him in June 2024.  Was doing well at the time.    10/05/2023 - Today he presents for follow-up.  Continues to do well.  Tells me he has seen his eye doctor recently, was diagnosed with macular degeneration a little while ago.  Overall doing well. Denies any chest pain, shortness of breath, palpitations, syncope, presyncope, dizziness, orthopnea, PND, swelling or significant weight changes, acute bleeding, or claudication.  He had an ED visit 03/10/2024 for chest pain at [facility]. Trops negative. Work-up overall unremarkable.   03/15/2024 - Here for ED follow-up.  He tells me he had chest pain last Thursday, previously in the morning he noted intermittently, nothing severe.  Typically noted when he would bend down, eventually did go to the ED later that evening and left the following morning.  He has had no more chest pain since. Denies any chest pain, shortness of breath, palpitations, syncope, presyncope, dizziness, orthopnea, PND, swelling or significant weight changes, acute bleeding, or claudication.  05/19/2024 - Here for follow-up. Doing well. Denies any more recent CP. Denies any chest pain, shortness of breath, palpitations, syncope,  presyncope, dizziness, orthopnea, PND, swelling or significant weight changes, acute bleeding, or claudication.  07/20/2024 - Here for follow-up. Doing great. Denies any chest pain, shortness of breath, palpitations, syncope, presyncope, dizziness, orthopnea, PND, swelling or significant weight changes, acute bleeding, or claudication.  SH: Retired from [employer]  Studies Reviewed: SABRA       EKG: EKG is not ordered today.   ETT 04/2024:    No ST deviation was noted.   Negative exercise stress test for ischemia   Exercise capacity is 95% or predicted based on age and gender  Echo 11/2022:  1. Left ventricular ejection fraction, by estimation, is 40%. The left  ventricle has moderately decreased function. The left ventricle  demonstrates global hypokinesis. Left ventricular diastolic parameters are  consistent with Grade I diastolic  dysfunction (impaired relaxation).   2. Right ventricular systolic function is low normal. The right  ventricular size is normal. There is normal pulmonary artery systolic  pressure.   3. The mitral valve is abnormal. Mild mitral valve regurgitation. No  evidence of mitral stenosis.   4. The tricuspid valve is abnormal.   5. The aortic valve has an indeterminant number of cusps. There is mild  calcification of the aortic valve. There is mild thickening of the aortic  valve. Aortic valve regurgitation is not visualized. No aortic stenosis is  present.   6. The inferior vena cava is normal in size with <50% respiratory  variability, suggesting right atrial pressure of 8 mmHg.   Comparison(s): Previous Echo showed LV EF 45-50%, Grade I diastolic  dysfunction,  HK of LV anteroseptal wall, mild LAE.   Physical Exam:   VS:  BP 126/72 (BP Location: Left Arm, Cuff Size: Normal)   Pulse 66   Ht 5' 10 (1.778 m)   Wt 169 lb (76.7 kg)   SpO2 95%   BMI 24.25 kg/m    Wt Readings from Last 3 Encounters:  07/20/24 169 lb (76.7 kg)  05/19/24 170 lb 3.2 oz (77.2 kg)   03/15/24 171 lb 6.4 oz (77.7 kg)    GEN: Well nourished, well developed in no acute distress NECK: No JVD; No carotid bruits CARDIAC: S1/S2, RRR, no murmurs, rubs, gallops RESPIRATORY:  Clear to auscultation without rales, wheezing or rhonchi  ABDOMEN: Soft, non-tender, non-distended EXTREMITIES:  No edema; No deformity   ASSESSMENT AND PLAN: .    HFmrEF Stage C, NYHA class I. TTE 11/2022 revealed EF slightly reduced from previous, now at 40%. Euvolemic and well compensated on exam. Right ventricular function at lower end of normal also. GDMT limited due to his past BP trends. Continue losartan, Toprol  XL, and Farxiga . Low sodium diet, fluid restriction <2L, and daily weights encouraged. Educated to contact our office for weight gain of 2 lbs overnight or 5 lbs in one week.  Care and ED precautions discussed.    CAD, s/p CABG in 2021 Stable with no anginal symptoms. No indication for ischemic evaluation. Most recent ETT was unremarkable noted above. Continue aspirin , atorvastatin, losartan, Toprol -XL, and NTG PRN. Heart healthy diet and regular cardiovascular exercise encouraged. Care and ED precautions discussed.   3. Mixed HLD Most recent LDL 45.  Continue atorvastatin. Heart healthy diet and regular cardiovascular exercise encouraged.   4. CKD stage IIIb Most recent kidney function stable. Avoid nephrotoxic agents.   Continue to follow-up with nephrology.   Dispo: Follow-up with Dr. Debera or APP in 6 months or sooner if anything changes.   Signed, Almarie Crate, NP   "

## 2024-08-08 ENCOUNTER — Other Ambulatory Visit: Payer: Self-pay | Admitting: Nurse Practitioner

## 2025-01-16 ENCOUNTER — Ambulatory Visit: Admitting: Nurse Practitioner
